# Patient Record
Sex: Female | Born: 1980 | Hispanic: No | Marital: Married | Smoking: Former smoker
Health system: Southern US, Community
[De-identification: ages and names within clinical notes are randomized; demographics above are authoritative.]

## PROBLEM LIST (undated history)

## (undated) DIAGNOSIS — O24419 Gestational diabetes mellitus in pregnancy, unspecified control: Secondary | ICD-10-CM

## (undated) HISTORY — PX: BREAST LUMPECTOMY: SHX2

## (undated) HISTORY — PX: MULTIPLE TOOTH EXTRACTIONS: SHX2053

## (undated) HISTORY — DX: Gestational diabetes mellitus in pregnancy, unspecified control: O24.419

---

## 2001-03-16 ENCOUNTER — Encounter: Admission: RE | Admit: 2001-03-16 | Discharge: 2001-03-16 | Payer: Self-pay | Admitting: *Deleted

## 2003-09-04 ENCOUNTER — Other Ambulatory Visit: Admission: RE | Admit: 2003-09-04 | Discharge: 2003-09-04 | Payer: Self-pay | Admitting: Obstetrics and Gynecology

## 2004-04-16 ENCOUNTER — Inpatient Hospital Stay (HOSPITAL_COMMUNITY): Admission: AD | Admit: 2004-04-16 | Discharge: 2004-04-16 | Payer: Self-pay | Admitting: Obstetrics and Gynecology

## 2004-04-19 ENCOUNTER — Inpatient Hospital Stay (HOSPITAL_COMMUNITY): Admission: AD | Admit: 2004-04-19 | Discharge: 2004-04-20 | Payer: Self-pay | Admitting: Obstetrics and Gynecology

## 2004-04-20 ENCOUNTER — Inpatient Hospital Stay (HOSPITAL_COMMUNITY): Admission: AD | Admit: 2004-04-20 | Discharge: 2004-04-22 | Payer: Self-pay | Admitting: Obstetrics & Gynecology

## 2004-06-16 ENCOUNTER — Other Ambulatory Visit: Admission: RE | Admit: 2004-06-16 | Discharge: 2004-06-16 | Payer: Self-pay | Admitting: Obstetrics and Gynecology

## 2007-10-22 ENCOUNTER — Other Ambulatory Visit: Admission: RE | Admit: 2007-10-22 | Discharge: 2007-10-22 | Payer: Self-pay | Admitting: Nurse Practitioner

## 2009-07-01 ENCOUNTER — Other Ambulatory Visit: Admission: RE | Admit: 2009-07-01 | Discharge: 2009-07-01 | Payer: Self-pay | Admitting: Obstetrics and Gynecology

## 2010-12-14 ENCOUNTER — Other Ambulatory Visit: Payer: Self-pay | Admitting: Nurse Practitioner

## 2010-12-14 ENCOUNTER — Other Ambulatory Visit (HOSPITAL_COMMUNITY)
Admission: RE | Admit: 2010-12-14 | Discharge: 2010-12-14 | Disposition: A | Discharge status: Home / Self Care | Payer: PRIVATE HEALTH INSURANCE | Source: Ambulatory Visit | Attending: Obstetrics and Gynecology | Admitting: Obstetrics and Gynecology

## 2010-12-14 DIAGNOSIS — Z01419 Encounter for gynecological examination (general) (routine) without abnormal findings: Secondary | ICD-10-CM | POA: Insufficient documentation

## 2010-12-14 DIAGNOSIS — Z113 Encounter for screening for infections with a predominantly sexual mode of transmission: Secondary | ICD-10-CM | POA: Insufficient documentation

## 2012-11-19 ENCOUNTER — Encounter: Payer: Self-pay | Admitting: Obstetrics & Gynecology

## 2012-11-19 ENCOUNTER — Ambulatory Visit (INDEPENDENT_AMBULATORY_CARE_PROVIDER_SITE_OTHER): Payer: BC Managed Care – PPO | Admitting: Obstetrics & Gynecology

## 2012-11-19 VITALS — BP 99/65 | Temp 98.7°F | Wt 147.0 lb

## 2012-11-19 DIAGNOSIS — Z3481 Encounter for supervision of other normal pregnancy, first trimester: Secondary | ICD-10-CM

## 2012-11-19 DIAGNOSIS — Z3480 Encounter for supervision of other normal pregnancy, unspecified trimester: Secondary | ICD-10-CM

## 2012-11-19 DIAGNOSIS — Z3201 Encounter for pregnancy test, result positive: Secondary | ICD-10-CM

## 2012-11-19 LAB — POCT URINE PREGNANCY: Preg Test, Ur: NEGATIVE

## 2012-11-19 LAB — POCT URINALYSIS DIPSTICK
Bilirubin, UA: NEGATIVE
Blood, UA: NEGATIVE
Glucose, UA: NEGATIVE
Ketones, UA: NEGATIVE
Leukocytes, UA: NEGATIVE
Nitrite, UA: NEGATIVE
Protein, UA: NEGATIVE
Spec Grav, UA: 1.015
Urobilinogen, UA: NEGATIVE
pH, UA: 5

## 2012-11-19 LAB — OB RESULTS CONSOLE GC/CHLAMYDIA
Chlamydia: NEGATIVE
Gonorrhea: NEGATIVE

## 2012-11-19 NOTE — Patient Instructions (Signed)
Influenza Virus Vaccine (Flucelvax) What is this medicine? INFLUENZA VIRUS VACCINE (in floo EN zuh VAHY ruhs vak SEEN) helps to reduce the risk of getting influenza also known as the flu. The vaccine only helps protect you against some strains of the flu. This medicine may be used for other purposes; ask your health care provider or pharmacist if you have questions. COMMON BRAND NAME(S): FLUCELVAX What should I tell my health care provider before I take this medicine? They need to know if you have any of these conditions: -bleeding disorder like hemophilia -fever or infection -Guillain-Barre syndrome or other neurological problems -immune system problems -infection with the human immunodeficiency virus (HIV) or AIDS -low blood platelet counts -multiple sclerosis -an unusual or allergic reaction to influenza virus vaccine, other medicines, foods, dyes or preservatives -pregnant or trying to get pregnant -breast-feeding How should I use this medicine? This vaccine is for injection into a muscle. It is given by a health care professional. A copy of Vaccine Information Statements will be given before each vaccination. Read this sheet carefully each time. The sheet may change frequently. Talk to your pediatrician regarding the use of this medicine in children. Special care may be needed. Overdosage: If you think you've taken too much of this medicine contact a poison control center or emergency room at once. Overdosage: If you think you have taken too much of this medicine contact a poison control center or emergency room at once. NOTE: This medicine is only for you. Do not share this medicine with others. What if I miss a dose? This does not apply. What may interact with this medicine? -chemotherapy or radiation therapy -medicines that lower your immune system like etanercept, anakinra, infliximab, and adalimumab -medicines that treat or prevent blood clots like  warfarin -phenytoin -steroid medicines like prednisone or cortisone -theophylline -vaccines This list may not describe all possible interactions. Give your health care provider a list of all the medicines, herbs, non-prescription drugs, or dietary supplements you use. Also tell them if you smoke, drink alcohol, or use illegal drugs. Some items may interact with your medicine. What should I watch for while using this medicine? Report any side effects that do not go away within 3 days to your doctor or health care professional. Call your health care provider if any unusual symptoms occur within 6 weeks of receiving this vaccine. You may still catch the flu, but the illness is not usually as bad. You cannot get the flu from the vaccine. The vaccine will not protect against colds or other illnesses that may cause fever. The vaccine is needed every year. What side effects may I notice from receiving this medicine? Side effects that you should report to your doctor or health care professional as soon as possible: -allergic reactions like skin rash, itching or hives, swelling of the face, lips, or tongue Side effects that usually do not require medical attention (Report these to your doctor or health care professional if they continue or are bothersome.): -fever -headache -muscle aches and pains -pain, tenderness, redness, or swelling at the injection site -tiredness This list may not describe all possible side effects. Call your doctor for medical advice about side effects. You may report side effects to FDA at 1-800-FDA-1088. Where should I keep my medicine? The vaccine will be given by a health care professional in a clinic, pharmacy, doctor's office, or other health care setting. You will not be given vaccine doses to store at home. NOTE: This sheet is a summary.   It may not cover all possible information. If you have questions about this medicine, talk to your doctor, pharmacist, or health care  provider.  2014, Elsevier/Gold Standard. (2010-12-08 14:06:47) Cystic Fibrosis Cystic fibrosis (CF) is an inherited disease caused by a defective gene. This defective gene leads to a change in a protein that affects cells that make mucus and sweat. Mucus is usually thin and watery. It is needed to keep the body working properly. But in CF, mucus becomes thick and very sticky. It clogs up the glands, airways and other passageways in the body. This makes it difficult to breathe freely. It also makes lung infections more likely. CF can also make it hard for the body to get the fat and protein it needs from food. CF is often thought of as a lung disease. Breathing (respiratory) failure is its most serious consequence. But, in CF, other parts of the body can also be affected including the skin, pancreas, liver, intestines, sinuses, and sex organs. The signs and symptoms of CF vary from person to person. They also differ by age. CF is a lifelong disease. However, people can live long, productive lives with care and treatment. CAUSES  CF is a genetic disease. CF is inherited as a recessive trait. That means a child can have CF even if neither of the child's parents have the disease. Both parents simply carry a gene that can cause CF. You are at greater risk for CF if:  One of your brothers or sisters has the disease.  Your parents carry the gene that causes it.  One of your parents has the disease. SYMPTOMS   Difficulty gaining weight.  Not growing as big as other children the same age.  Chronic gas or bloating.  Bulky or greasy bowel movements.  Tissue from the rectum protrudes from the anal opening (rectal prolapse).  Inflammation of the pancreas (recurrent pancreatitis).  Prolonged yellowing of the skin (jaundice).  Unusually salty sweat or skin.  Heat exhaustion with low salt in the blood.  Chronic coughing and/or wheezing.  Shortness of breath.  Frequent lung infections  (bronchiolitis, bronchitis, pneumonia).  Frequent sinusitis and/or nasal polyps (small growths in the nose).  Club-shaped fingers or toes. DIAGNOSIS  A caregiver will probably ask many questions and order some tests. These will help determine if CF is present. They also will provide valuable information about the nature and extent of the disease. Questions and tests may include:  Family history. You will be asked whether any relatives have CF.  Symptoms. You will be asked whether any of the symptoms listed above are present.  Sweat test. This is the standard test used to make a diagnosis of CF. It is also called a sweat chloride test. A chemical will be applied to make a patch of skin sweat. Then the sweat will be analyzed to see if it contains the chemicals present in CF.  DNA testing. This can be used to find what genetic mutations in CFTR are present.  Newborn screening. A blood test measures a chemical in the blood that is often elevated in newborns with CF. Extra testing is done if a blood test is abnormal or if a new baby shows signs of CF.  Nasal potential difference measurement. This test shows how CF is affecting the lining of the body's upper breathing passages. Other tests that will be performed after a diagnosis of CF is made:  Blood tests to evaluate salt balance, liver function, vitamin levels and blood counts.  X-rays. Chest and/or sinus x-rays can show how the lungs and breathing system are affected by the disease.  Lung (pulmonary) function tests. They tell how well the lungs are working.  Sputum culture. This test analyzes mucus from the lungs to find out if there is an infection.  Stool testing. This is done to see if the pancreas is working normally.  Fertility testing. In males, an ultrasound scan of the testes might be done. RISKS AND COMPLICATIONS CF can lead to other problems. They might include:  Frequent lung infections. These include pneumonia and  bronchitis.  Frequent sinus infections (sinusitis).  Coughing up blood (hemoptysis).  Collapsed lung (pneumothorax). A small hole develops in the lung, and air leaks out.  Malnutrition. This can occur because the disease prevents the body from digesting food properly and absorbing needed nutrients.  Delayed growth. This is caused by malnutrition.  Nasal polyps.  Thick mucus can block the ducts into and out of the pancreas (pancreatitis). This causes the pancreas to become inflamed.  Diabetes. This develops if thick mucus keeps the pancreas from working properly. The pancreas controls the level of sugar in the blood.  Gallstones.  Liver damage. Thick mucus can block the duct that carries bile (a digestive fluid) out of the liver to be expelled from the body.  Rectal prolapse (the inner lining of the rectum pushes outside the body). Excessive coughing can cause this.  Fertility problems. In men, the tube that connects sexual organs can become clogged by mucus. Also, the vas deferens (a tube that moves sperm through the testes) may be missing in men with CF. Women with CF are sometimes less fertile than those without the disease.  Development of weak, brittle bones (osteoporosis). This occurs when the body does not absorb the vitamins needed to keep bones strong. TREATMENT  Patients with CF should be managed through an accredited CF Center (to find a CF Center near you go to the Cystic Fibrosis Foundation website at www.GamingBus.hu). Treatment may include:  A high calorie diet. This can help maintain a healthy weight. That, in turn, helps reduce coughing and breathing problems.  A high salt diet or salt supplements. The body needs a balance of salt and other chemicals.  Drinking lots of fluids.  Exercise.  Enzyme capsules to aid digestion.  Nutritional supplements.  Medications to help with breathing.  Medications to thin the mucus.  Vaccines to prevent infections.  Insulin  medication, if diabetes is present.  Antibiotics to treat infections.  Chest percussion ("thumping") treatments to help move mucus.  Oxygen.  Surgery:  To relieve a digestive system blockage.  To remove nasal polyps.  Lung or liver transplants. At times, people with CF need to be given some treatments in a hospital. HOME CARE INSTRUCTIONS  To get started with home care:  Learn how to give all medicines that have been prescribed.  Learn which symptoms require medical attention.  Learn how to give treatments to clear mucus.  Learn techniques to improve breathing.  Learn how to prepare the right kind of foods.  Join a support group for people with CF. Groups also exist for family members.  Do not smoke and keep yourself and your child away from smoke.  Be as physically active as possible.  Keep shots (immunizations/vaccinations) up-to-date.  Wash hands often. This will reduce the chance of infection. SEEK MEDICAL CARE IF:   Mucus contains blood.  There is a new cough or change in your cough.  The amount  or consistency of mucus changes.  It becomes more difficult to breathe.  You or your child does not feel like eating or is losing weight.  You or your child has no energy.  You or your child has severe constipation.  You or your child has severe diarrhea.  You or your child is throwing up dark green fluid.  You or your child has an oral temperature above 102 F (38.9 C).  Your baby is older than 3 months with a rectal temperature of 100.5 F (38.1 C) or higher for more than 1 day. SEEK IMMEDIATE MEDICAL CARE IF:   You or your child has an oral temperature above 102 F (38.9 C), not controlled by medicine.  Your baby is older than 3 months with a rectal temperature of 102 F (38.9 C) or higher.  Your baby is 31 months old or younger with a rectal temperature of 100.4 F (38 C) or higher. Document Released: 10/30/2007 Document Revised: 03/21/2011  Document Reviewed: 01/17/2008 [facility] Patient Information 2014 [place], [place].

## 2012-11-19 NOTE — Progress Notes (Signed)
Subjective:    Stacy [name] is being seen today for her first obstetrical visit.  This is a planned pregnancy.  . Relationship with FOB: significant other, living together. Patient does intend to breast feed. Pregnancy history fully reviewed.  Menstrual History: OB History   Grav Para Term Preterm Abortions TAB SAB Ect Mult Living   1 1 1       1        Patient's last menstrual period was 09/18/2012.    The following portions of the patient's history were reviewed and updated as appropriate: allergies, current medications, past family history, past medical history, past social history, past surgical history and problem list.  Review of Systems Pertinent items are noted in HPI.    Objective:      General Appearance:    Alert, cooperative, no distress, appears stated age  Head:    Normocephalic, without obvious abnormality, atraumatic  Eyes:    PERRL, conjunctiva/corneas clear, EOM's intact, fundi    benign, both eyes  Ears:    Normal TM's and external ear canals, both ears  Nose:   Nares normal, septum midline, mucosa normal, no drainage    or sinus tenderness  Throat:   Lips, mucosa, and tongue normal; teeth and gums normal  Neck:   Supple, symmetrical, trachea midline, no adenopathy;    thyroid:  no enlargement/tenderness/nodules; no carotid   bruit or JVD  Back:     Symmetric, no curvature, ROM normal, no CVA tenderness  Lungs:     Clear to auscultation bilaterally, respirations unlabored  Chest Wall:    No tenderness or deformity   Heart:    Regular rate and rhythm, S1 and S2 normal, no murmur, rub   or gallop  Breast Exam:    No tenderness, masses, or nipple abnormality  Abdomen:     Soft, non-tender, bowel sounds active all four quadrants,    no masses, no organomegaly  Genitalia:    Normal female without lesion, discharge or tenderness  Extremities:   Extremities normal, atraumatic, no cyanosis or edema  Pulses:   2+ and symmetric all extremities  Skin:   Skin  color, texture, turgor normal, no rashes or lesions  Lymph nodes:   Cervical, supraclavicular, and axillary nodes normal  Neurologic:   CNII-XII intact, normal strength, sensation and reflexes    throughout  Informal U/S: CRL [redacted]w[redacted]d; cardiac activity present      Assessment:    Pregnancy at 8+ weeks  Plan:    Initial labs drawn. Prenatal vitamins.  Counseling provided regarding continued use of seat belts, cessation of alcohol consumption, smoking or use of illicit drugs; infection precautions i.e., influenza/TDAP immunizations, toxoplasmosis,CMV, parvovirus, listeria and varicella; workplace safety, exercise during pregnancy; routine dental care, safe medications, sexual activity, hot tubs, saunas, pools, travel, caffeine use, fish and methlymercury, potential toxins, hair treatments, varicose veins Weight gain recommendations reviewed: underweight/BMI< 18.5--> gain 28 - 40 lbs; normal weight/BMI 18.5 - 24.9--> gain 25 - 35 lbs; overweight/BMI 25 - 29.9--> gain 15 - 25 lbs; obese/BMI >30->gain  11 - 20 lbs Problem list reviewed and updated. FIRST/CF mutation testing discussed. Role of ultrasound in pregnancy discussed. Amniocentesis discussed: not indicated. Follow up in 6 weeks. 50% of 20 min visit spent on counseling and coordination of care.

## 2012-11-20 LAB — OBSTETRIC PANEL
Antibody Screen: NEGATIVE
Basophils Absolute: 0 10*3/uL (ref 0.0–0.1)
Basophils Relative: 0 % (ref 0–1)
Eosinophils Absolute: 0.1 10*3/uL (ref 0.0–0.7)
Eosinophils Relative: 1 % (ref 0–5)
HCT: 35.8 % — ABNORMAL LOW (ref 36.0–46.0)
Hemoglobin: 12.3 g/dL (ref 12.0–15.0)
Hepatitis B Surface Ag: NEGATIVE
Lymphocytes Relative: 23 % (ref 12–46)
Lymphs Abs: 2.2 10*3/uL (ref 0.7–4.0)
MCH: 28.9 pg (ref 26.0–34.0)
MCHC: 34.4 g/dL (ref 30.0–36.0)
MCV: 84 fL (ref 78.0–100.0)
Monocytes Absolute: 0.8 10*3/uL (ref 0.1–1.0)
Monocytes Relative: 9 % (ref 3–12)
Neutro Abs: 6.3 10*3/uL (ref 1.7–7.7)
Neutrophils Relative %: 67 % (ref 43–77)
Platelets: 322 10*3/uL (ref 150–400)
RBC: 4.26 MIL/uL (ref 3.87–5.11)
RDW: 14.3 % (ref 11.5–15.5)
Rh Type: POSITIVE
Rubella: 1.43 Index — ABNORMAL HIGH (ref <0.90)
WBC: 9.5 10*3/uL (ref 4.0–10.5)

## 2012-11-20 LAB — VITAMIN D 25 HYDROXY (VIT D DEFICIENCY, FRACTURES): Vit D, 25-Hydroxy: 36 ng/mL (ref 30–89)

## 2012-11-20 LAB — HIV ANTIBODY (ROUTINE TESTING W REFLEX): HIV: NONREACTIVE

## 2012-11-20 LAB — GC/CHLAMYDIA PROBE AMP
CT Probe RNA: NEGATIVE
GC Probe RNA: NEGATIVE

## 2012-11-20 LAB — CULTURE, OB URINE: Organism ID, Bacteria: NO GROWTH

## 2012-11-20 LAB — HEMOGLOBIN A1C
Hgb A1c MFr Bld: 5.6 % (ref <5.7)
Mean Plasma Glucose: 114 mg/dL (ref <117)

## 2012-11-21 LAB — HEMOGLOBINOPATHY EVALUATION
Hemoglobin Other: 0 %
Hgb A2 Quant: 2.8 % (ref 2.2–3.2)
Hgb A: 96.4 % — ABNORMAL LOW (ref 96.8–97.8)
Hgb F Quant: 0.8 % (ref 0.0–2.0)
Hgb S Quant: 0 %

## 2012-12-27 ENCOUNTER — Encounter: Payer: Self-pay | Admitting: Obstetrics & Gynecology

## 2012-12-27 ENCOUNTER — Ambulatory Visit (INDEPENDENT_AMBULATORY_CARE_PROVIDER_SITE_OTHER): Payer: BC Managed Care – PPO | Admitting: Obstetrics & Gynecology

## 2012-12-27 VITALS — BP 100/64 | Temp 97.8°F | Ht 62.0 in | Wt 151.0 lb

## 2012-12-27 DIAGNOSIS — Z3481 Encounter for supervision of other normal pregnancy, first trimester: Secondary | ICD-10-CM

## 2012-12-27 DIAGNOSIS — Z3480 Encounter for supervision of other normal pregnancy, unspecified trimester: Secondary | ICD-10-CM

## 2012-12-27 LAB — POCT URINALYSIS DIPSTICK
Bilirubin, UA: NEGATIVE
Blood, UA: NEGATIVE
Glucose, UA: NEGATIVE
Ketones, UA: NEGATIVE
Leukocytes, UA: NEGATIVE
Nitrite, UA: NEGATIVE
Protein, UA: NEGATIVE
Spec Grav, UA: 1.02
Urobilinogen, UA: NEGATIVE
pH, UA: 5

## 2012-12-27 NOTE — Progress Notes (Signed)
HR - 80 Pt in office today for routine OB visit, denies concerns at this time

## 2012-12-31 ENCOUNTER — Encounter: Payer: BC Managed Care – PPO | Admitting: Obstetrics & Gynecology

## 2013-01-02 NOTE — Patient Instructions (Signed)
Second Trimester of Pregnancy The second trimester is from week 13 through week 28, months 4 through 6. The second trimester is often a time when you feel your best. Your body has also adjusted to being pregnant, and you begin to feel better physically. Usually, morning sickness has lessened or quit completely, you may have more energy, and you may have an increase in appetite. The second trimester is also a time when the fetus is growing rapidly. At the end of the sixth month, the fetus is about 9 inches long and weighs about 1 pounds. You will likely begin to feel the baby move (quickening) between 18 and 20 weeks of the pregnancy. BODY CHANGES Your body goes through many changes during pregnancy. The changes vary from woman to woman.   Your weight will continue to increase. You will notice your lower abdomen bulging out.  You may begin to get stretch marks on your hips, abdomen, and breasts.  You may develop headaches that can be relieved by medicines approved by your caregiver.  You may urinate more often because the fetus is pressing on your bladder.  You may develop or continue to have heartburn as a result of your pregnancy.  You may develop constipation because certain hormones are causing the muscles that push waste through your intestines to slow down.  You may develop hemorrhoids or swollen, bulging veins (varicose veins).  You may have back pain because of the weight gain and pregnancy hormones relaxing your joints between the bones in your pelvis and as a result of a shift in weight and the muscles that support your balance.  Your breasts will continue to grow and be tender.  Your gums may bleed and may be sensitive to brushing and flossing.  Dark spots or blotches (chloasma, mask of pregnancy) may develop on your face. This will likely fade after the baby is born.  A dark line from your belly button to the pubic area (linea nigra) may appear. This will likely fade after the  baby is born. WHAT TO EXPECT AT YOUR PRENATAL VISITS During a routine prenatal visit:  You will be weighed to make sure you and the fetus are growing normally.  Your blood pressure will be taken.  Your abdomen will be measured to track your baby's growth.  The fetal heartbeat will be listened to.  Any test results from the previous visit will be discussed. Your caregiver may ask you:  How you are feeling.  If you are feeling the baby move.  If you have had any abnormal symptoms, such as leaking fluid, bleeding, severe headaches, or abdominal cramping.  If you have any questions. Other tests that may be performed during your second trimester include:  Blood tests that check for:  Low iron levels (anemia).  Gestational diabetes (between 24 and 28 weeks).  Rh antibodies.  Urine tests to check for infections, diabetes, or protein in the urine.  An ultrasound to confirm the proper growth and development of the baby.  An amniocentesis to check for possible genetic problems.  Fetal screens for spina bifida and Down syndrome. HOME CARE INSTRUCTIONS   Avoid all smoking, herbs, alcohol, and unprescribed drugs. These chemicals affect the formation and growth of the baby.  Follow your caregiver's instructions regarding medicine use. There are medicines that are either safe or unsafe to take during pregnancy.  Exercise only as directed by your caregiver. Experiencing uterine cramps is a good sign to stop exercising.  Continue to eat regular,   healthy meals.  Wear a good support bra for breast tenderness.  Do not use hot tubs, steam rooms, or saunas.  Wear your seat belt at all times when driving.  Avoid raw meat, uncooked cheese, cat litter boxes, and soil used by cats. These carry germs that can cause birth defects in the baby.  Take your prenatal vitamins.  Try taking a stool softener (if your caregiver approves) if you develop constipation. Eat more high-fiber foods,  such as fresh vegetables or fruit and whole grains. Drink plenty of fluids to keep your urine clear or pale yellow.  Take warm sitz baths to soothe any pain or discomfort caused by hemorrhoids. Use hemorrhoid cream if your caregiver approves.  If you develop varicose veins, wear support hose. Elevate your feet for 15 minutes, 3 4 times a day. Limit salt in your diet.  Avoid heavy lifting, wear low heel shoes, and practice good posture.  Rest with your legs elevated if you have leg cramps or low back pain.  Visit your dentist if you have not gone yet during your pregnancy. Use a soft toothbrush to brush your teeth and be gentle when you floss.  A sexual relationship may be continued unless your caregiver directs you otherwise.  Continue to go to all your prenatal visits as directed by your caregiver. SEEK MEDICAL CARE IF:   You have dizziness.  You have mild pelvic cramps, pelvic pressure, or nagging pain in the abdominal area.  You have persistent nausea, vomiting, or diarrhea.  You have a bad smelling vaginal discharge.  You have pain with urination. SEEK IMMEDIATE MEDICAL CARE IF:   You have a fever.  You are leaking fluid from your vagina.  You have spotting or bleeding from your vagina.  You have severe abdominal cramping or pain.  You have rapid weight gain or loss.  You have shortness of breath with chest pain.  You notice sudden or extreme swelling of your face, hands, ankles, feet, or legs.  You have not felt your baby move in over an hour.  You have severe headaches that do not go away with medicine.  You have vision changes. Document Released: 12/21/2000 Document Revised: 08/29/2012 Document Reviewed: 02/28/2012 [employer] Patient Information 2014 [employer].  

## 2013-01-10 NOTE — L&D Delivery Note (Signed)
Delivery Note At 11:12 AM a viable female was delivered via Vaginal, Spontaneous Delivery (Presentation: ; Occiput Anterior).  APGAR: , ; weight .   Placenta status: , .  Cord: 3 vessels with the following complications: None.  Cord pH: not done  Anesthesia: Epidural  Episiotomy:  Lacerations:  Suture Repair: 2.0 Est. Blood Loss (mL):   Mom to postpartum.  Baby to Couplet care / Skin to Skin.  MARSHALL,BERNARD A 06/30/2013, 11:23 AM

## 2013-01-16 ENCOUNTER — Telehealth: Payer: Self-pay | Admitting: *Deleted

## 2013-01-17 ENCOUNTER — Other Ambulatory Visit (INDEPENDENT_AMBULATORY_CARE_PROVIDER_SITE_OTHER): Payer: BC Managed Care – PPO

## 2013-01-17 VITALS — BP 101/66 | HR 97 | Temp 98.5°F

## 2013-01-17 DIAGNOSIS — J029 Acute pharyngitis, unspecified: Secondary | ICD-10-CM

## 2013-01-17 LAB — POCT RAPID STREP A (OFFICE): Rapid Strep A Screen: NEGATIVE

## 2013-01-17 NOTE — Progress Notes (Signed)
Pt in office for rapid strep test

## 2013-01-17 NOTE — Addendum Note (Signed)
Addended by: Ashley RoyaltyWILLIAMS, Karson Reede V on: 01/17/2013 09:28 AM   Modules accepted: Orders

## 2013-01-29 ENCOUNTER — Other Ambulatory Visit: Payer: Self-pay | Admitting: *Deleted

## 2013-01-29 ENCOUNTER — Other Ambulatory Visit: Payer: BC Managed Care – PPO

## 2013-01-29 ENCOUNTER — Ambulatory Visit (INDEPENDENT_AMBULATORY_CARE_PROVIDER_SITE_OTHER): Payer: BC Managed Care – PPO

## 2013-01-29 DIAGNOSIS — Z1389 Encounter for screening for other disorder: Secondary | ICD-10-CM

## 2013-01-30 ENCOUNTER — Encounter: Payer: Self-pay | Admitting: Obstetrics

## 2013-01-30 LAB — US OB DETAIL + 14 WK

## 2013-02-07 ENCOUNTER — Ambulatory Visit (INDEPENDENT_AMBULATORY_CARE_PROVIDER_SITE_OTHER): Payer: BC Managed Care – PPO | Admitting: Obstetrics & Gynecology

## 2013-02-07 VITALS — BP 100/64 | Temp 98.2°F | Wt 155.0 lb

## 2013-02-07 DIAGNOSIS — Z3480 Encounter for supervision of other normal pregnancy, unspecified trimester: Secondary | ICD-10-CM

## 2013-02-07 LAB — POCT URINALYSIS DIPSTICK
Bilirubin, UA: NEGATIVE
Blood, UA: NEGATIVE
Glucose, UA: NEGATIVE
Nitrite, UA: NEGATIVE
Protein, UA: NEGATIVE
Spec Grav, UA: 1.025
Urobilinogen, UA: NEGATIVE
pH, UA: 6

## 2013-02-07 NOTE — Progress Notes (Signed)
Pulse 82 Pt states she is doing well. Pt would like to know if u/s had normal results.  Pt states that she wasn't sure of gender.

## 2013-02-07 NOTE — Patient Instructions (Signed)
Glucose Tolerance Test This is a test to see how your body processes carbohydrates. This test is often done to check patients for diabetes or the possibility of developing it. PREPARATION FOR TEST You should have nothing to eat or drink 12 hours before the test. You will be given a form of sugar (glucose) and then blood samples will be drawn from your vein to determine the level of sugar in your blood. Alternatively, blood may be drawn from your finger for testing. You should not smoke or exercise during the test. NORMAL FINDINGS  Fasting: 70-115 mg/dL  30 minutes: less than 200 mg/dL  1 hour: less than 200 mg/dL  2 hours: less than 140 mg/dL  3 hours: 70-115 mg/dL  4 hours: 70-115 mg/dL Ranges for normal findings may vary among different laboratories and hospitals. You should always check with your doctor after having lab work or other tests done to discuss the meaning of your test results and whether your values are considered within normal limits. MEANING OF TEST Your caregiver will go over the test results with you and discuss the importance and meaning of your results, as well as treatment options and the need for additional tests. OBTAINING THE TEST RESULTS It is your responsibility to obtain your test results. Ask the lab or department performing the test when and how you will get your results. Document Released: 01/20/2004 Document Revised: 03/21/2011 Document Reviewed: 12/08/2007 [employer] Patient Information 2014 [employer].  

## 2013-03-07 ENCOUNTER — Other Ambulatory Visit: Payer: BC Managed Care – PPO

## 2013-03-07 ENCOUNTER — Encounter: Payer: BC Managed Care – PPO | Admitting: Obstetrics & Gynecology

## 2013-03-14 ENCOUNTER — Other Ambulatory Visit: Payer: Medicaid Other

## 2013-03-14 ENCOUNTER — Ambulatory Visit (INDEPENDENT_AMBULATORY_CARE_PROVIDER_SITE_OTHER): Payer: BC Managed Care – PPO | Admitting: Obstetrics & Gynecology

## 2013-03-14 VITALS — BP 93/63 | Temp 98.6°F | Wt 161.0 lb

## 2013-03-14 DIAGNOSIS — N760 Acute vaginitis: Secondary | ICD-10-CM

## 2013-03-14 DIAGNOSIS — Z3480 Encounter for supervision of other normal pregnancy, unspecified trimester: Secondary | ICD-10-CM

## 2013-03-14 LAB — CBC
HCT: 33.6 % — ABNORMAL LOW (ref 36.0–46.0)
Hemoglobin: 11.3 g/dL — ABNORMAL LOW (ref 12.0–15.0)
MCH: 28.4 pg (ref 26.0–34.0)
MCHC: 33.6 g/dL (ref 30.0–36.0)
MCV: 84.4 fL (ref 78.0–100.0)
Platelets: 315 10*3/uL (ref 150–400)
RBC: 3.98 MIL/uL (ref 3.87–5.11)
RDW: 13.9 % (ref 11.5–15.5)
WBC: 9.7 10*3/uL (ref 4.0–10.5)

## 2013-03-14 LAB — POCT URINALYSIS DIPSTICK
Bilirubin, UA: NEGATIVE
Blood, UA: NEGATIVE
Glucose, UA: NEGATIVE
Ketones, UA: NEGATIVE
Nitrite, UA: NEGATIVE
Protein, UA: NEGATIVE
Spec Grav, UA: <=1.005
Urobilinogen, UA: NEGATIVE
pH, UA: 8

## 2013-03-14 LAB — POCT WET PREP (WET MOUNT)
Clue Cells Wet Prep Whiff POC: NEGATIVE
KOH Wet Prep POC: NEGATIVE
Trichomonas Wet Prep HPF POC: NEGATIVE

## 2013-03-14 MED ORDER — TERCONAZOLE 0.4 % VA CREA: 1.0000 | TOPICAL_CREAM | Freq: Every day | VAGINAL | Status: DC

## 2013-03-14 NOTE — Patient Instructions (Signed)
Vaginitis Vaginitis is an inflammation of the vagina. It is most often caused by a change in the normal balance of the bacteria and yeast that live in the vagina. This change in balance causes an overgrowth of certain bacteria or yeast, which causes the inflammation. There are different types of vaginitis, but the most common types are:  Bacterial vaginosis.  Yeast infection (candidiasis).  Trichomoniasis vaginitis. This is a sexually transmitted infection (STI).  Viral vaginitis.  Atropic vaginitis.  Allergic vaginitis. CAUSES  The cause depends on the type of vaginitis. Vaginitis can be caused by:  Bacteria (bacterial vaginosis).  Yeast (yeast infection).  A parasite (trichomoniasis vaginitis)  A virus (viral vaginitis).  Low hormone levels (atrophic vaginitis). Low hormone levels can occur during pregnancy, breastfeeding, or after menopause.  Irritants, such as bubble baths, scented tampons, and feminine sprays (allergic vaginitis). Other factors can change the normal balance of the yeast and bacteria that live in the vagina. These include:  Antibiotic medicines.  Poor hygiene.  Diaphragms, vaginal sponges, spermicides, birth control pills, and intrauterine devices (IUD).  Sexual intercourse.  Infection.  Uncontrolled diabetes.  A weakened immune system. SYMPTOMS  Symptoms can vary depending on the cause of the vaginitis. Common symptoms include:  Abnormal vaginal discharge.  The discharge is white, gray, or yellow with bacterial vaginosis.  The discharge is thick, white, and cheesy with a yeast infection.  The discharge is frothy and yellow or greenish with trichomoniasis.  A bad vaginal odor.  The odor is fishy with bacterial vaginosis.  Vaginal itching, pain, or swelling.  Painful intercourse.  Pain or burning when urinating. Sometimes, there are no symptoms. TREATMENT  Treatment will vary depending on the type of infection.   Bacterial  vaginosis and trichomoniasis are often treated with antibiotic creams or pills.  Yeast infections are often treated with antifungal medicines, such as vaginal creams or suppositories.  Viral vaginitis has no cure, but symptoms can be treated with medicines that relieve discomfort. Your sexual partner should be treated as well.  Atrophic vaginitis may be treated with an estrogen cream, pill, suppository, or vaginal ring. If vaginal dryness occurs, lubricants and moisturizing creams may help. You may be told to avoid scented soaps, sprays, or douches.  Allergic vaginitis treatment involves quitting the use of the product that is causing the problem. Vaginal creams can be used to treat the symptoms. HOME CARE INSTRUCTIONS   Take all medicines as directed by your caregiver.  Keep your genital area clean and dry. Avoid soap and only rinse the area with water.  Avoid douching. It can remove the healthy bacteria in the vagina.  Do not use tampons or have sexual intercourse until your vaginitis has been treated. Use sanitary pads while you have vaginitis.  Wipe from front to back. This avoids the spread of bacteria from the rectum to the vagina.  Let air reach your genital area.  Wear cotton underwear to decrease moisture buildup.  Avoid wearing underwear while you sleep until your vaginitis is gone.  Avoid tight pants and underwear or nylons without a cotton panel.  Take off wet clothing (especially bathing suits) as soon as possible.  Use mild, non-scented products. Avoid using irritants, such as:  Scented feminine sprays.  Fabric softeners.  Scented detergents.  Scented tampons.  Scented soaps or bubble baths.  Practice safe sex and use condoms. Condoms may prevent the spread of trichomoniasis and viral vaginitis. SEEK MEDICAL CARE IF:   You have abdominal pain.  You   have a fever or persistent symptoms for more than 2 3 days.  You have a fever and your symptoms suddenly  get worse. Document Released: 10/24/2006 Document Revised: 09/21/2011 Document Reviewed: 06/09/2011 [employer] Patient Information 2014 [employer].  

## 2013-03-14 NOTE — Progress Notes (Signed)
Pulse:85  Patient states she would like to be checked for an infection. Patient states she is having a thick yellow discharge. Patient would like to discuss her lab results. Possible candida vulvovaginitis.

## 2013-03-15 LAB — WET PREP BY MOLECULAR PROBE
Candida species: POSITIVE — AB
Gardnerella vaginalis: NEGATIVE
Trichomonas vaginosis: NEGATIVE

## 2013-03-15 LAB — GLUCOSE TOLERANCE, 2 HOURS W/ 1HR
Glucose, 1 hour: 168 mg/dL (ref 70–170)
Glucose, 2 hour: 141 mg/dL — ABNORMAL HIGH (ref 70–139)
Glucose, Fasting: 84 mg/dL (ref 70–99)

## 2013-03-15 LAB — HIV ANTIBODY (ROUTINE TESTING W REFLEX): HIV: NONREACTIVE

## 2013-03-15 LAB — RPR

## 2013-03-24 DIAGNOSIS — Z3480 Encounter for supervision of other normal pregnancy, unspecified trimester: Secondary | ICD-10-CM

## 2013-04-03 ENCOUNTER — Encounter: Payer: Self-pay | Admitting: Obstetrics

## 2013-04-03 ENCOUNTER — Ambulatory Visit (INDEPENDENT_AMBULATORY_CARE_PROVIDER_SITE_OTHER): Payer: BC Managed Care – PPO | Admitting: Obstetrics

## 2013-04-03 VITALS — BP 102/69 | Temp 97.9°F | Wt 161.0 lb

## 2013-04-03 DIAGNOSIS — Z3480 Encounter for supervision of other normal pregnancy, unspecified trimester: Secondary | ICD-10-CM

## 2013-04-03 LAB — POCT URINALYSIS DIPSTICK
Bilirubin, UA: NEGATIVE
Blood, UA: NEGATIVE
Glucose, UA: NEGATIVE
Ketones, UA: NEGATIVE
Leukocytes, UA: NEGATIVE
Nitrite, UA: NEGATIVE
Protein, UA: NEGATIVE
Spec Grav, UA: <=1.005
Urobilinogen, UA: NEGATIVE
pH, UA: 7

## 2013-04-03 NOTE — Progress Notes (Signed)
Pulse: 87 Patient denies any concerns. Patient would like to know the results of her 2 HR GTT.

## 2013-04-04 ENCOUNTER — Encounter: Payer: BC Managed Care – PPO | Admitting: Obstetrics & Gynecology

## 2013-04-25 ENCOUNTER — Ambulatory Visit (INDEPENDENT_AMBULATORY_CARE_PROVIDER_SITE_OTHER): Payer: Medicaid Other | Admitting: Obstetrics & Gynecology

## 2013-04-25 VITALS — BP 109/69 | Temp 98.2°F | Wt 164.0 lb

## 2013-04-25 DIAGNOSIS — Z3480 Encounter for supervision of other normal pregnancy, unspecified trimester: Secondary | ICD-10-CM

## 2013-04-25 LAB — POCT URINALYSIS DIPSTICK
Bilirubin, UA: NEGATIVE
Blood, UA: NEGATIVE
Glucose, UA: NEGATIVE
Nitrite, UA: NEGATIVE
Spec Grav, UA: >=1.03
Urobilinogen, UA: NEGATIVE
pH, UA: 5

## 2013-04-25 NOTE — Progress Notes (Deleted)
Pulse 90 Pt states that she is having lower pelvic and back pain.

## 2013-04-28 NOTE — Progress Notes (Signed)
Subjective:    Stacy [name] is a 33 y.o. female being seen today for her obstetrical visit. She is at 3132w5d gestation. Patient reports backache. Fetal movement: normal.   No past medical history on file.  Past Surgical History  Procedure Laterality Date  . Multiple tooth extractions Bilateral     for orthodontic care    Current outpatient prescriptions:Prenatal Vit-Fe Fumarate-FA (MULTIVITAMIN-PRENATAL) 27-0.8 MG TABS tablet, Take 1 tablet by mouth daily at 12 noon., Disp: , Rfl:  No Known Allergies  History  Substance Use Topics  . Smoking status: Former Games developermoker  . Smokeless tobacco: Never Used  . Alcohol Use: No    Family History  Problem Relation Age of Onset  . Diabetes Mother   . Hyperlipidemia Mother   . Diabetes Father      Review of Systems Constitutional: negative for anorexia Gastrointestinal: negative for abdominal pain Genitourinary:negative for vaginal discharge Musculoskeletal:positive for back pain Behavioral/Psych: negative for depression and tobacco use   Objective:    BP 109/69  Temp(Src) 98.2 F (36.8 C)  Wt 74.39 kg (164 lb)  LMP 09/18/2012 FHT:  140 BPM  Uterine Size: size equals dates  Presentation: cephalic     Assessment:    Pregnancy @ 2632w5d weeks  Doing well Plan:   labs reviewed, problem list updated. Cigarette smoking: never smoked. Orders Placed This Encounter  Procedures  . POCT urinalysis dipstick  Follow up in 2 Weeks.

## 2013-04-28 NOTE — Patient Instructions (Signed)
Third Trimester of Pregnancy  The third trimester is from week 29 through week 42, months 7 through 9. The third trimester is a time when the fetus is growing rapidly. At the end of the ninth month, the fetus is about 20 inches in length and weighs 6 10 pounds.   BODY CHANGES  Your body goes through many changes during pregnancy. The changes vary from woman to woman.    Your weight will continue to increase. You can expect to gain 25 35 pounds (11 16 kg) by the end of the pregnancy.   You may begin to get stretch marks on your hips, abdomen, and breasts.   You may urinate more often because the fetus is moving lower into your pelvis and pressing on your bladder.   You may develop or continue to have heartburn as a result of your pregnancy.   You may develop constipation because certain hormones are causing the muscles that push waste through your intestines to slow down.   You may develop hemorrhoids or swollen, bulging veins (varicose veins).   You may have pelvic pain because of the weight gain and pregnancy hormones relaxing your joints between the bones in your pelvis. Back aches may result from over exertion of the muscles supporting your posture.   Your breasts will continue to grow and be tender. A yellow discharge may leak from your breasts called colostrum.   Your belly button may stick out.   You may feel short of breath because of your expanding uterus.   You may notice the fetus "dropping," or moving lower in your abdomen.   You may have a bloody mucus discharge. This usually occurs a few days to a week before labor begins.   Your cervix becomes thin and soft (effaced) near your due date.  WHAT TO EXPECT AT YOUR PRENATAL EXAMS   You will have prenatal exams every 2 weeks until week 36. Then, you will have weekly prenatal exams. During a routine prenatal visit:   You will be weighed to make sure you and the fetus are growing normally.   Your blood pressure is taken.   Your abdomen will be  measured to track your baby's growth.   The fetal heartbeat will be listened to.   Any test results from the previous visit will be discussed.   You may have a cervical check near your due date to see if you have effaced.  At around 36 weeks, your caregiver will check your cervix. At the same time, your caregiver will also perform a test on the secretions of the vaginal tissue. This test is to determine if a type of bacteria, Group B streptococcus, is present. Your caregiver will explain this further.  Your caregiver may ask you:   What your birth plan is.   How you are feeling.   If you are feeling the baby move.   If you have had any abnormal symptoms, such as leaking fluid, bleeding, severe headaches, or abdominal cramping.   If you have any questions.  Other tests or screenings that may be performed during your third trimester include:   Blood tests that check for low iron levels (anemia).   Fetal testing to check the health, activity level, and growth of the fetus. Testing is done if you have certain medical conditions or if there are problems during the pregnancy.  FALSE LABOR  You may feel small, irregular contractions that eventually go away. These are called Braxton Hicks contractions, or   false labor. Contractions may last for hours, days, or even weeks before true labor sets in. If contractions come at regular intervals, intensify, or become painful, it is best to be seen by your caregiver.   SIGNS OF LABOR    Menstrual-like cramps.   Contractions that are 5 minutes apart or less.   Contractions that start on the top of the uterus and spread down to the lower abdomen and back.   A sense of increased pelvic pressure or back pain.   A watery or bloody mucus discharge that comes from the vagina.  If you have any of these signs before the 37th week of pregnancy, call your caregiver right away. You need to go to the hospital to get checked immediately.  HOME CARE INSTRUCTIONS    Avoid all  smoking, herbs, alcohol, and unprescribed drugs. These chemicals affect the formation and growth of the baby.   Follow your caregiver's instructions regarding medicine use. There are medicines that are either safe or unsafe to take during pregnancy.   Exercise only as directed by your caregiver. Experiencing uterine cramps is a good sign to stop exercising.   Continue to eat regular, healthy meals.   Wear a good support bra for breast tenderness.   Do not use hot tubs, steam rooms, or saunas.   Wear your seat belt at all times when driving.   Avoid raw meat, uncooked cheese, cat litter boxes, and soil used by cats. These carry germs that can cause birth defects in the baby.   Take your prenatal vitamins.   Try taking a stool softener (if your caregiver approves) if you develop constipation. Eat more high-fiber foods, such as fresh vegetables or fruit and whole grains. Drink plenty of fluids to keep your urine clear or pale yellow.   Take warm sitz baths to soothe any pain or discomfort caused by hemorrhoids. Use hemorrhoid cream if your caregiver approves.   If you develop varicose veins, wear support hose. Elevate your feet for 15 minutes, 3 4 times a day. Limit salt in your diet.   Avoid heavy lifting, wear low heal shoes, and practice good posture.   Rest a lot with your legs elevated if you have leg cramps or low back pain.   Visit your dentist if you have not gone during your pregnancy. Use a soft toothbrush to brush your teeth and be gentle when you floss.   A sexual relationship may be continued unless your caregiver directs you otherwise.   Do not travel far distances unless it is absolutely necessary and only with the approval of your caregiver.   Take prenatal classes to understand, practice, and ask questions about the labor and delivery.   Make a trial run to the hospital.   Pack your hospital bag.   Prepare the baby's nursery.   Continue to go to all your prenatal visits as directed  by your caregiver.  SEEK MEDICAL CARE IF:   You are unsure if you are in labor or if your water has broken.   You have dizziness.   You have mild pelvic cramps, pelvic pressure, or nagging pain in your abdominal area.   You have persistent nausea, vomiting, or diarrhea.   You have a bad smelling vaginal discharge.   You have pain with urination.  SEEK IMMEDIATE MEDICAL CARE IF:    You have a fever.   You are leaking fluid from your vagina.   You have spotting or bleeding from your vagina.     You have severe abdominal cramping or pain.   You have rapid weight loss or gain.   You have shortness of breath with chest pain.   You notice sudden or extreme swelling of your face, hands, ankles, feet, or legs.   You have not felt your baby move in over an hour.   You have severe headaches that do not go away with medicine.   You have vision changes.  Document Released: 12/21/2000 Document Revised: 08/29/2012 Document Reviewed: 02/28/2012  [employer] Patient Information 2014 [employer].

## 2013-05-09 ENCOUNTER — Encounter: Payer: Medicaid Other | Admitting: Obstetrics & Gynecology

## 2013-05-15 ENCOUNTER — Encounter: Payer: Self-pay | Admitting: Obstetrics & Gynecology

## 2013-05-15 ENCOUNTER — Ambulatory Visit (INDEPENDENT_AMBULATORY_CARE_PROVIDER_SITE_OTHER): Payer: Medicaid Other | Admitting: Obstetrics & Gynecology

## 2013-05-15 VITALS — BP 112/73 | HR 102 | Temp 98.6°F | Wt 167.4 lb

## 2013-05-15 DIAGNOSIS — Z3480 Encounter for supervision of other normal pregnancy, unspecified trimester: Secondary | ICD-10-CM

## 2013-05-15 LAB — POCT URINALYSIS DIPSTICK
Bilirubin, UA: NEGATIVE
Blood, UA: NEGATIVE
Glucose, UA: NEGATIVE
Nitrite, UA: NEGATIVE
Spec Grav, UA: 1.02
Urobilinogen, UA: NEGATIVE
pH, UA: 6

## 2013-05-15 NOTE — Progress Notes (Signed)
Subjective:    Stacy FritzLiliana [name] is a 33 y.o. female being seen today for her obstetrical visit. She is at 6283w1d gestation. Patient reports no complaints. Fetal movement: normal.   No past medical history on file.  Past Surgical History  Procedure Laterality Date  . Multiple tooth extractions Bilateral     for orthodontic care    Current outpatient prescriptions:Prenatal Vit-Fe Fumarate-FA (MULTIVITAMIN-PRENATAL) 27-0.8 MG TABS tablet, Take 1 tablet by mouth daily at 12 noon., Disp: , Rfl:  No Known Allergies  History  Substance Use Topics  . Smoking status: Former Games developermoker  . Smokeless tobacco: Never Used  . Alcohol Use: No    Family History  Problem Relation Age of Onset  . Diabetes Mother   . Hyperlipidemia Mother   . Diabetes Father      Review of Systems Constitutional: negative for anorexia Gastrointestinal: negative for abdominal pain Genitourinary:negative for vaginal discharge Musculoskeletal:positive for back pain Behavioral/Psych: negative for depression and tobacco use   Objective:    BP 112/73  Pulse 102  Temp(Src) 98.6 F (37 C)  Wt 75.932 kg (167 lb 6.4 oz)  LMP 09/18/2012 FHT:  140 BPM  Uterine Size: size equals dates  Presentation: cephalic     Assessment:    Pregnancy @ 3283w1d weeks  Doing well Plan:   labs reviewed, problem list updated.  Orders Placed This Encounter  Procedures  . POCT urinalysis dipstick  Follow up in 2 Weeks.

## 2013-05-15 NOTE — Patient Instructions (Signed)
Patient information: Group B streptococcus and pregnancy (Beyond the Basics)  Authors Karen M Puopolo, MD, PhD Carol J Baker, MD Section Editors Charles J Lockwood, MD Daniel J Sexton, MD Deputy Editor Vanessa A Barss, MD Disclosures  All topics are updated as new evidence becomes available and our peer review process is complete.  Literature review current through: Feb 2014.  This topic last updated: Jul 11, 2011.  INTRODUCTION - Group B streptococcus (GBS) is a bacterium that can cause serious infections in pregnant women and newborn babies. GBS is one of many types of streptococcal bacteria, sometimes called "strep." This article discusses GBS, its effect on pregnant women and infants, and ways to prevent complications of GBS. More detailed information about GBS is available by subscription. (See "Group B streptococcal infection in pregnant women".) WHAT IS GROUP B STREP INFECTION? - GBS is commonly found in the digestive system and the vagina. In healthy adults, GBS is not harmful and does not cause problems. But in pregnant women and newborn infants, being infected with GBS can cause serious illness. Approximately one in three to four pregnant women in the [place] carries GBS in their gastrointestinal system and/or in their vagina. Carrying GBS is not the same as being infected. Carriers are not sick and do not need treatment during pregnancy. There is no treatment that can stop you from carrying GBS.  Pregnant women who are carriers of GBS infrequently become infected with GBS. GBS can cause urinary tract infections, infection of the amniotic fluid (bag of water), and infection of the uterus after delivery. GBS infections during pregnancy may lead to preterm labor.  Pregnant women who carry GBS can pass on the bacteria to their newborns, and some of those babies become infected with GBS. Newborns who are infected with GBS can develop pneumonia (lung infection), septicemia (blood infection), or  meningitis (infection of the lining of the brain and spinal cord). These complications can be prevented by giving intravenous antibiotics during labor to any woman who is at risk of GBS infection. You are at risk of GBS infection if: You have a urine culture during your current pregnancy showing GBS  You have a vaginal and rectal culture during your current pregnancy showing GBS  You had an infant infected with GBS in the past GROUP B STREP PREVENTION - Most doctors and nurses recommend a urine culture early in your pregnancy to be sure that you do not have a bladder infection without symptoms. If you urine culture shows GBS or other bacteria, you may be treated with an antibiotic. If you have symptoms of urinary infection, such as pain with urination, any time during your pregnancy, a urine culture is done. If GBS grows from the urine culture, it should be treated with an antibiotic, and you should also receive intravenous antibiotics during labor. Expert groups recommend that all pregnant women have a GBS culture at 35 to 37 weeks of pregnancy. The culture is done by swabbing the vagina and rectum. If your GBS culture is positive, you will be given an intravenous antibiotic during labor. If you have preterm labor, the culture is done then and an intravenous antibiotic is given until the baby is born or the labor is stopped by your health care provider. If you have a positive GBS culture and you have an allergy to penicillin, be sure your doctor and nurse are aware of this allergy and tell them what happened with the allergy. If you had only a rash or itching, this   is not a serious allergy, and you can receive a common drug related to the penicillin. If you had a serious allergy (for example, trouble breathing, swelling of your face) you may need an additional test to determine which antibiotic should be used during labor. Being treated with an antibiotic during labor greatly reduces the chance that you or  your newborn will develop infections related to GBS. It is important to note that young infants up to age 3 months can also develop septicemia, meningitis and other serious infections from GBS. Being treated with an antibiotic during labor does not reduce the chance that your baby will develop this later type of infection. There is currently no known way of preventing this later-onset GBS disease. WHERE TO GET MORE INFORMATION - Your healthcare provider is the best source of information for questions and concerns related to your medical problem.  

## 2013-05-16 ENCOUNTER — Encounter: Payer: Medicaid Other | Admitting: Obstetrics & Gynecology

## 2013-05-29 ENCOUNTER — Encounter: Payer: Medicaid Other | Admitting: Obstetrics & Gynecology

## 2013-05-30 ENCOUNTER — Encounter: Payer: Self-pay | Admitting: Obstetrics & Gynecology

## 2013-05-30 ENCOUNTER — Ambulatory Visit (INDEPENDENT_AMBULATORY_CARE_PROVIDER_SITE_OTHER): Payer: Medicaid Other | Admitting: Obstetrics & Gynecology

## 2013-05-30 VITALS — BP 114/74 | HR 92 | Temp 98.2°F | Wt 168.0 lb

## 2013-05-30 DIAGNOSIS — Z3480 Encounter for supervision of other normal pregnancy, unspecified trimester: Secondary | ICD-10-CM

## 2013-05-30 LAB — POCT URINALYSIS DIPSTICK
Bilirubin, UA: NEGATIVE
Blood, UA: NEGATIVE
Glucose, UA: NEGATIVE
Leukocytes, UA: NEGATIVE
Nitrite, UA: NEGATIVE
Spec Grav, UA: 1.02
Urobilinogen, UA: NEGATIVE
pH, UA: 5.5

## 2013-06-01 LAB — STREP B DNA PROBE: GBSP: NOT DETECTED

## 2013-06-01 NOTE — Patient Instructions (Signed)
Patient information: Group B streptococcus and pregnancy (Beyond the Basics)  Authors Karen M Puopolo, MD, PhD Carol J Baker, MD Section Editors Charles J Lockwood, MD Daniel J Sexton, MD Deputy Editor Vanessa A Barss, MD Disclosures  All topics are updated as new evidence becomes available and our peer review process is complete.  Literature review current through: Feb 2014.  This topic last updated: Jul 11, 2011.  INTRODUCTION - Group B streptococcus (GBS) is a bacterium that can cause serious infections in pregnant women and newborn babies. GBS is one of many types of streptococcal bacteria, sometimes called "strep." This article discusses GBS, its effect on pregnant women and infants, and ways to prevent complications of GBS. More detailed information about GBS is available by subscription. (See "Group B streptococcal infection in pregnant women".) WHAT IS GROUP B STREP INFECTION? - GBS is commonly found in the digestive system and the vagina. In healthy adults, GBS is not harmful and does not cause problems. But in pregnant women and newborn infants, being infected with GBS can cause serious illness. Approximately one in three to four pregnant women in the [place] carries GBS in their gastrointestinal system and/or in their vagina. Carrying GBS is not the same as being infected. Carriers are not sick and do not need treatment during pregnancy. There is no treatment that can stop you from carrying GBS.  Pregnant women who are carriers of GBS infrequently become infected with GBS. GBS can cause urinary tract infections, infection of the amniotic fluid (bag of water), and infection of the uterus after delivery. GBS infections during pregnancy may lead to preterm labor.  Pregnant women who carry GBS can pass on the bacteria to their newborns, and some of those babies become infected with GBS. Newborns who are infected with GBS can develop pneumonia (lung infection), septicemia (blood infection), or  meningitis (infection of the lining of the brain and spinal cord). These complications can be prevented by giving intravenous antibiotics during labor to any woman who is at risk of GBS infection. You are at risk of GBS infection if: You have a urine culture during your current pregnancy showing GBS  You have a vaginal and rectal culture during your current pregnancy showing GBS  You had an infant infected with GBS in the past GROUP B STREP PREVENTION - Most doctors and nurses recommend a urine culture early in your pregnancy to be sure that you do not have a bladder infection without symptoms. If you urine culture shows GBS or other bacteria, you may be treated with an antibiotic. If you have symptoms of urinary infection, such as pain with urination, any time during your pregnancy, a urine culture is done. If GBS grows from the urine culture, it should be treated with an antibiotic, and you should also receive intravenous antibiotics during labor. Expert groups recommend that all pregnant women have a GBS culture at 35 to 37 weeks of pregnancy. The culture is done by swabbing the vagina and rectum. If your GBS culture is positive, you will be given an intravenous antibiotic during labor. If you have preterm labor, the culture is done then and an intravenous antibiotic is given until the baby is born or the labor is stopped by your health care provider. If you have a positive GBS culture and you have an allergy to penicillin, be sure your doctor and nurse are aware of this allergy and tell them what happened with the allergy. If you had only a rash or itching, this   is not a serious allergy, and you can receive a common drug related to the penicillin. If you had a serious allergy (for example, trouble breathing, swelling of your face) you may need an additional test to determine which antibiotic should be used during labor. Being treated with an antibiotic during labor greatly reduces the chance that you or  your newborn will develop infections related to GBS. It is important to note that young infants up to age 3 months can also develop septicemia, meningitis and other serious infections from GBS. Being treated with an antibiotic during labor does not reduce the chance that your baby will develop this later type of infection. There is currently no known way of preventing this later-onset GBS disease. WHERE TO GET MORE INFORMATION - Your healthcare provider is the best source of information for questions and concerns related to your medical problem.  

## 2013-06-01 NOTE — Progress Notes (Signed)
Subjective:    Stacy [name] is a 33 y.o. female being seen today for her obstetrical visit. She is at [redacted]w[redacted]d  gestation. Patient reports no complaints. Fetal movement: normal.   No past medical history on file.  Past Surgical History  Procedure Laterality Date  . Multiple tooth extractions Bilateral     for orthodontic care    Current outpatient prescriptions:Prenatal Vit-Fe Fumarate-FA (MULTIVITAMIN-PRENATAL) 27-0.8 MG TABS tablet, Take 1 tablet by mouth daily at 12 noon., Disp: , Rfl:  No Known Allergies  History  Substance Use Topics  . Smoking status: Former Games developer  . Smokeless tobacco: Never Used  . Alcohol Use: No    Family History  Problem Relation Age of Onset  . Diabetes Mother   . Hyperlipidemia Mother   . Diabetes Father      Review of Systems Constitutional: negative for anorexia Gastrointestinal: negative for abdominal pain Genitourinary:negative for vaginal discharge Musculoskeletal:positive for back pain Behavioral/Psych: negative for depression and tobacco use   Objective:    BP 114/74  Pulse 92  Temp(Src) 98.2 F (36.8 C)  Wt 76.204 kg (168 lb)  LMP 09/18/2012 FHT:  140 BPM  Uterine Size: size equals dates  Presentation: cephalic     Assessment:    Pregnancy @ [redacted]w[redacted]d  weeks  Doing well Plan:   labs reviewed, problem list updated.  Orders Placed This Encounter  Procedures  . Strep B DNA probe  . POCT urinalysis dipstick  Follow up in 1 Week.

## 2013-06-10 ENCOUNTER — Encounter: Payer: Self-pay | Admitting: Obstetrics & Gynecology

## 2013-06-10 ENCOUNTER — Ambulatory Visit (INDEPENDENT_AMBULATORY_CARE_PROVIDER_SITE_OTHER): Payer: Medicaid Other | Admitting: Obstetrics & Gynecology

## 2013-06-10 VITALS — BP 115/71 | HR 93 | Temp 97.6°F | Wt 169.0 lb

## 2013-06-10 DIAGNOSIS — Z3480 Encounter for supervision of other normal pregnancy, unspecified trimester: Secondary | ICD-10-CM

## 2013-06-10 LAB — POCT URINALYSIS DIPSTICK
Blood, UA: NEGATIVE
Glucose, UA: NEGATIVE
Ketones, UA: NEGATIVE
Nitrite, UA: NEGATIVE
Protein, UA: NEGATIVE
Spec Grav, UA: 1.015
pH, UA: 7

## 2013-06-11 NOTE — Progress Notes (Signed)
Subjective:    Stacy [name] is a 33 y.o. female being seen today for her obstetrical visit. She is at [redacted]w[redacted]d  gestation. Patient reports no complaints. Fetal movement: normal.   No past medical history on file.  Past Surgical History  Procedure Laterality Date  . Multiple tooth extractions Bilateral     for orthodontic care    Current outpatient prescriptions:Prenatal Vit-Fe Fumarate-FA (MULTIVITAMIN-PRENATAL) 27-0.8 MG TABS tablet, Take 1 tablet by mouth daily at 12 noon., Disp: , Rfl:  No Known Allergies  History  Substance Use Topics  . Smoking status: Former Games developer  . Smokeless tobacco: Never Used  . Alcohol Use: No    Family History  Problem Relation Age of Onset  . Diabetes Mother   . Hyperlipidemia Mother   . Diabetes Father      Review of Systems Constitutional: negative for anorexia Gastrointestinal: negative for abdominal pain Genitourinary:negative for vaginal discharge Musculoskeletal:positive for back pain Behavioral/Psych: negative for depression and tobacco use   Objective:    BP 115/71  Pulse 93  Temp(Src) 97.6 F (36.4 C)  Wt 76.658 kg (169 lb)  LMP 09/18/2012 FHT:  140 BPM  Uterine Size: size equals dates  Presentation: cephalic     Assessment:    Pregnancy @ [redacted]w[redacted]d  weeks  Doing well Plan:   labs reviewed, problem list updated.  Orders Placed This Encounter  Procedures  . POCT urinalysis dipstick  Follow up in 1 Week.

## 2013-06-19 ENCOUNTER — Ambulatory Visit (INDEPENDENT_AMBULATORY_CARE_PROVIDER_SITE_OTHER): Payer: Medicaid Other | Admitting: Obstetrics & Gynecology

## 2013-06-19 ENCOUNTER — Encounter: Payer: Self-pay | Admitting: Obstetrics & Gynecology

## 2013-06-19 VITALS — BP 115/68 | HR 92 | Temp 97.1°F | Wt 172.0 lb

## 2013-06-19 DIAGNOSIS — Z3480 Encounter for supervision of other normal pregnancy, unspecified trimester: Secondary | ICD-10-CM

## 2013-06-19 LAB — POCT URINALYSIS DIPSTICK
Bilirubin, UA: NEGATIVE
Blood, UA: NEGATIVE
Glucose, UA: NEGATIVE
Leukocytes, UA: NEGATIVE
Nitrite, UA: NEGATIVE
Protein, UA: NEGATIVE
Spec Grav, UA: 1.02
Urobilinogen, UA: NEGATIVE
pH, UA: 5

## 2013-06-21 NOTE — Progress Notes (Signed)
Subjective:    Stacy [name] is a 33 y.o. female being seen today for her obstetrical visit. She is at 83108w1d  gestation. Patient reports no complaints. Fetal movement: normal.   No past medical history on file.  Past Surgical History  Procedure Laterality Date  . Multiple tooth extractions Bilateral     for orthodontic care    Current outpatient prescriptions:Prenatal Vit-Fe Fumarate-FA (MULTIVITAMIN-PRENATAL) 27-0.8 MG TABS tablet, Take 1 tablet by mouth daily at 12 noon., Disp: , Rfl:  No Known Allergies  History  Substance Use Topics  . Smoking status: Former Games developermoker  . Smokeless tobacco: Never Used  . Alcohol Use: No    Family History  Problem Relation Age of Onset  . Diabetes Mother   . Hyperlipidemia Mother   . Diabetes Father      Review of Systems Constitutional: negative for anorexia Gastrointestinal: negative for abdominal pain Genitourinary:negative for vaginal discharge Musculoskeletal:positive for back pain Behavioral/Psych: negative for depression and tobacco use   Objective:    BP 115/68  Pulse 92  Temp(Src) 97.1 F (36.2 C)  Wt 78.019 kg (172 lb)  LMP 09/18/2012 FHT:  140 BPM  Uterine Size: size equals dates  Presentation: cephalic     Assessment:    Pregnancy @ 2108w1d  weeks  Doing well Plan:   labs reviewed, problem list updated.  Orders Placed This Encounter  Procedures  . POCT urinalysis dipstick  Follow up in 1 Week.

## 2013-06-24 ENCOUNTER — Ambulatory Visit (INDEPENDENT_AMBULATORY_CARE_PROVIDER_SITE_OTHER): Payer: Medicaid Other | Admitting: Obstetrics & Gynecology

## 2013-06-24 ENCOUNTER — Encounter: Payer: Self-pay | Admitting: Obstetrics & Gynecology

## 2013-06-24 ENCOUNTER — Other Ambulatory Visit: Payer: Self-pay | Admitting: *Deleted

## 2013-06-24 VITALS — BP 126/83 | HR 89 | Temp 98.3°F | Wt 172.0 lb

## 2013-06-24 DIAGNOSIS — Z3480 Encounter for supervision of other normal pregnancy, unspecified trimester: Secondary | ICD-10-CM

## 2013-06-24 DIAGNOSIS — O480 Post-term pregnancy: Secondary | ICD-10-CM

## 2013-06-24 LAB — POCT URINALYSIS DIPSTICK
Bilirubin, UA: NEGATIVE
Blood, UA: NEGATIVE
Glucose, UA: NEGATIVE
Ketones, UA: NEGATIVE
Leukocytes, UA: NEGATIVE
Nitrite, UA: NEGATIVE
Spec Grav, UA: 1.015
Urobilinogen, UA: NEGATIVE
pH, UA: 6

## 2013-06-24 NOTE — Patient Instructions (Signed)
Labor Induction  Labor induction is when steps are taken to cause a pregnant woman to begin the labor process. Most women go into labor on their own between 37 weeks and 42 weeks of the pregnancy. When this does not happen or when there is a medical need, methods may be used to induce labor. Labor induction causes a pregnant woman's uterus to contract. It also causes the cervix to soften (ripen), open (dilate), and thin out (efface). Usually, labor is not induced before 39 weeks of the pregnancy unless there is a problem with the baby or mother.  Before inducing labor, your health care provider will consider a number of factors, including the following:  The medical condition of you and the baby.   How many weeks along you are.   The status of the baby's lung maturity.   The condition of the cervix.   The position of the baby.  WHAT ARE THE REASONS FOR LABOR INDUCTION? Labor may be induced for the following reasons:  The health of the baby or mother is at risk.   The pregnancy is overdue by 1 week or more.   The water breaks but labor does not start on its own.   The mother has a health condition or serious illness, such as high blood pressure, infection, placental abruption, or diabetes.  The amniotic fluid amounts are low around the baby.   The baby is distressed.  Convenience or wanting the baby to be born on a certain date is not a reason for inducing labor. WHAT METHODS ARE USED FOR LABOR INDUCTION? Several methods of labor induction may be used, such as:   Prostaglandin medicine. This medicine causes the cervix to dilate and ripen. The medicine will also start contractions. It can be taken by mouth or by inserting a suppository into the vagina.   Inserting a thin tube (catheter) with a balloon on the end into the vagina to dilate the cervix. Once inserted, the balloon is expanded with water, which causes the cervix to open.   Stripping the membranes. Your health  care provider separates amniotic sac tissue from the cervix, causing the cervix to be stretched and causing the release of a hormone called progesterone. This may cause the uterus to contract. It is often done during an office visit. You will be sent home to wait for the contractions to begin. You will then come in for an induction.   Breaking the water. Your health care provider makes a hole in the amniotic sac using a small instrument. Once the amniotic sac breaks, contractions should begin. This may still take hours to see an effect.   Medicine to trigger or strengthen contractions. This medicine is given through an IV access tube inserted into a vein in your arm.  All of the methods of induction, besides stripping the membranes, will be done in the hospital. Induction is done in the hospital so that you and the baby can be carefully monitored.  HOW LONG DOES IT TAKE FOR LABOR TO BE INDUCED? Some inductions can take up to 2 3 days. Depending on the cervix, it usually takes less time. It takes longer when you are induced early in the pregnancy or if this is your first pregnancy. If a mother is still pregnant and the induction has been going on for 2 3 days, either the mother will be sent home or a cesarean delivery will be needed. WHAT ARE THE RISKS ASSOCIATED WITH LABOR INDUCTION? Some of the risks   of induction include:   Changes in fetal heart rate, such as too high, too low, or erratic.   Fetal distress.   Chance of infection for the mother and baby.   Increased chance of having a cesarean delivery.   Breaking off (abruption) of the placenta from the uterus (rare).   Uterine rupture (very rare).  When induction is needed for medical reasons, the benefits of induction may outweigh the risks. WHAT ARE SOME REASONS FOR NOT INDUCING LABOR? Labor induction should not be done if:   It is shown that your baby does not tolerate labor.   You have had previous surgeries on your  uterus, such as a myomectomy or the removal of fibroids.   Your placenta lies very low in the uterus and blocks the opening of the cervix (placenta previa).   Your baby is not in a head-down position.   The umbilical cord drops down into the birth canal in front of the baby. This could cut off the baby's blood and oxygen supply.   You have had a previous cesarean delivery.   There are unusual circumstances, such as the baby being extremely premature.  Document Released: 05/18/2006 Document Revised: 08/29/2012 Document Reviewed: 07/26/2012 Exit[employer] Patient Information 2014 [employer].  

## 2013-06-24 NOTE — Progress Notes (Signed)
Subjective:    Stacy [name] is a 33 y.o. female being seen today for her obstetrical visit. She is at 820w6d  gestation. Patient reports no complaints. Fetal movement: normal.   No past medical history on file.  Past Surgical History  Procedure Laterality Date  . Multiple tooth extractions Bilateral     for orthodontic care    Current outpatient prescriptions:Prenatal Vit-Fe Fumarate-FA (MULTIVITAMIN-PRENATAL) 27-0.8 MG TABS tablet, Take 1 tablet by mouth daily at 12 noon., Disp: , Rfl:  No Known Allergies  History  Substance Use Topics  . Smoking status: Former Games developermoker  . Smokeless tobacco: Never Used  . Alcohol Use: No    Family History  Problem Relation Age of Onset  . Diabetes Mother   . Hyperlipidemia Mother   . Diabetes Father      Review of Systems Constitutional: negative for anorexia Gastrointestinal: negative for abdominal pain Genitourinary:negative for vaginal discharge Musculoskeletal:positive for back pain Behavioral/Psych: negative for depression and tobacco use   Objective:    BP 126/83  Pulse 89  Temp(Src) 98.3 F (36.8 C)  Wt 78.019 kg (172 lb)  LMP 09/18/2012 FHT:  140 BPM  Uterine Size: size equals dates  Presentation: cephalic   EFW by Leopold's: 3000 - 3300 g  Assessment:    Pregnancy @ 5920w6d  weeks  Doing well Plan:   labs reviewed, problem list updated. BPP on 6/19 IOL @ 41 wks Orders Placed This Encounter  Procedures  . POCT urinalysis dipstick

## 2013-06-28 ENCOUNTER — Ambulatory Visit (HOSPITAL_COMMUNITY)
Admission: RE | Admit: 2013-06-28 | Discharge: 2013-06-28 | Disposition: A | Discharge status: Home / Self Care | Payer: Medicaid Other | Source: Ambulatory Visit | Attending: Obstetrics & Gynecology | Admitting: Obstetrics & Gynecology

## 2013-06-28 ENCOUNTER — Encounter (HOSPITAL_COMMUNITY): Payer: Self-pay | Admitting: *Deleted

## 2013-06-28 ENCOUNTER — Telehealth (HOSPITAL_COMMUNITY): Payer: Self-pay | Admitting: *Deleted

## 2013-06-28 DIAGNOSIS — O480 Post-term pregnancy: Secondary | ICD-10-CM

## 2013-06-28 NOTE — Telephone Encounter (Signed)
Preadmission screen  

## 2013-06-29 ENCOUNTER — Encounter (HOSPITAL_COMMUNITY): Payer: Self-pay

## 2013-06-29 ENCOUNTER — Inpatient Hospital Stay (HOSPITAL_COMMUNITY)
Admission: AD | Admit: 2013-06-29 | Discharge: 2013-07-01 | DRG: 775 | Disposition: A | Discharge status: Home / Self Care | Payer: Medicaid Other | Source: Ambulatory Visit | Attending: Obstetrics | Admitting: Obstetrics

## 2013-06-29 DIAGNOSIS — E669 Obesity, unspecified: Principal | ICD-10-CM | POA: Diagnosis present

## 2013-06-29 DIAGNOSIS — Z6831 Body mass index (BMI) 31.0-31.9, adult: Secondary | ICD-10-CM

## 2013-06-29 DIAGNOSIS — Z87891 Personal history of nicotine dependence: Secondary | ICD-10-CM

## 2013-06-29 DIAGNOSIS — Z3483 Encounter for supervision of other normal pregnancy, third trimester: Secondary | ICD-10-CM

## 2013-06-29 DIAGNOSIS — O99214 Obesity complicating childbirth: Principal | ICD-10-CM

## 2013-06-29 LAB — CBC
HCT: 32.4 % — ABNORMAL LOW (ref 36.0–46.0)
Hemoglobin: 10.7 g/dL — ABNORMAL LOW (ref 12.0–15.0)
MCH: 27.1 pg (ref 26.0–34.0)
MCHC: 33 g/dL (ref 30.0–36.0)
MCV: 82 fL (ref 78.0–100.0)
Platelets: 255 10*3/uL (ref 150–400)
RBC: 3.95 MIL/uL (ref 3.87–5.11)
RDW: 15.1 % (ref 11.5–15.5)
WBC: 10.3 10*3/uL (ref 4.0–10.5)

## 2013-06-29 MED ORDER — DIPHENHYDRAMINE HCL 50 MG/ML IJ SOLN: 12.5000 mg | INTRAMUSCULAR | Status: DC | PRN

## 2013-06-29 MED ORDER — FLEET ENEMA 7-19 GM/118ML RE ENEM: 1.0000 | ENEMA | RECTAL | Status: DC | PRN

## 2013-06-29 MED ORDER — FENTANYL 2.5 MCG/ML BUPIVACAINE 1/10 % EPIDURAL INFUSION (WH - ANES)
14.0000 mL/h | INTRAMUSCULAR | Status: DC | PRN
  Administered 2013-06-30 (×2): 14 mL/h via EPIDURAL
  Filled 2013-06-29 (×2): qty 125

## 2013-06-29 MED ORDER — EPHEDRINE 5 MG/ML INJ
10.0000 mg | INTRAVENOUS | Status: DC | PRN
  Filled 2013-06-29: qty 2

## 2013-06-29 MED ORDER — PHENYLEPHRINE 40 MCG/ML (10ML) SYRINGE FOR IV PUSH (FOR BLOOD PRESSURE SUPPORT)
80.0000 ug | PREFILLED_SYRINGE | INTRAVENOUS | Status: DC | PRN
  Filled 2013-06-29: qty 2

## 2013-06-29 MED ORDER — CITRIC ACID-SODIUM CITRATE 334-500 MG/5ML PO SOLN
30.0000 mL | ORAL | Status: DC | PRN
Start: 2013-06-29 — End: 2013-06-30

## 2013-06-29 MED ORDER — LACTATED RINGERS IV SOLN
500.0000 mL | Freq: Once | INTRAVENOUS | Status: AC
  Administered 2013-06-29: 500 mL via INTRAVENOUS

## 2013-06-29 MED ORDER — EPHEDRINE 5 MG/ML INJ
10.0000 mg | INTRAVENOUS | Status: DC | PRN
  Filled 2013-06-29: qty 4
  Filled 2013-06-29: qty 2

## 2013-06-29 MED ORDER — OXYCODONE-ACETAMINOPHEN 5-325 MG PO TABS: 1.0000 | ORAL_TABLET | ORAL | Status: DC | PRN

## 2013-06-29 MED ORDER — LACTATED RINGERS IV SOLN: 500.0000 mL | INTRAVENOUS | Status: DC | PRN

## 2013-06-29 MED ORDER — BUTORPHANOL TARTRATE 1 MG/ML IJ SOLN: 1.0000 mg | INTRAMUSCULAR | Status: DC | PRN

## 2013-06-29 MED ORDER — ACETAMINOPHEN 325 MG PO TABS: 650.0000 mg | ORAL_TABLET | ORAL | Status: DC | PRN

## 2013-06-29 MED ORDER — LIDOCAINE HCL (PF) 1 % IJ SOLN
30.0000 mL | INTRAMUSCULAR | Status: AC | PRN
  Administered 2013-06-30 (×2): 5 mL via SUBCUTANEOUS

## 2013-06-29 MED ORDER — IBUPROFEN 600 MG PO TABS
600.0000 mg | ORAL_TABLET | Freq: Four times a day (QID) | ORAL | Status: DC | PRN
  Administered 2013-06-30: 600 mg via ORAL
  Filled 2013-06-29: qty 1

## 2013-06-29 MED ORDER — ONDANSETRON HCL 4 MG/2ML IJ SOLN: 4.0000 mg | Freq: Four times a day (QID) | INTRAMUSCULAR | Status: DC | PRN

## 2013-06-29 MED ORDER — OXYTOCIN BOLUS FROM INFUSION
500.0000 mL | INTRAVENOUS | Status: DC
  Administered 2013-06-30: 500 mL via INTRAVENOUS

## 2013-06-29 MED ORDER — PHENYLEPHRINE 40 MCG/ML (10ML) SYRINGE FOR IV PUSH (FOR BLOOD PRESSURE SUPPORT)
80.0000 ug | PREFILLED_SYRINGE | INTRAVENOUS | Status: DC | PRN
  Filled 2013-06-29: qty 10
  Filled 2013-06-29: qty 2

## 2013-06-29 MED ORDER — LACTATED RINGERS IV SOLN
INTRAVENOUS | Status: DC
  Administered 2013-06-30: 09:00:00 via INTRAVENOUS

## 2013-06-29 MED ORDER — OXYTOCIN 40 UNITS IN LACTATED RINGERS INFUSION - SIMPLE MED
62.5000 mL/h | INTRAVENOUS | Status: DC
Start: 2013-06-29 — End: 2013-06-30

## 2013-06-29 NOTE — MAU Provider Note (Signed)
Stacy FritzLiliana [name] is a 33 y.o. G2P1001 @ 3667w4d gestation who presents to the MAU with ? ROM. She has been leaking some fluid and started contracting.  SSE No pooling noted. Fern negative.   BP 117/71  Pulse 78  Resp 20  Ht 5\' 2"  (1.575 m)  Wt 173 lb 3.2 oz (78.563 kg)  BMI 31.67 kg/m2  LMP 09/18/2012  Dilation: 4 Effacement (%): 80 Cervical Position: Posterior Station: -2 Presentation: Vertex Exam by:: Remigio EisenmengerBenji Stanley RN   33 y.o. female with contractions and dilated to 4 cm.   RN to call Dr. Gaynell FaceMarshall with report and monitor tracing

## 2013-06-29 NOTE — MAU Note (Addendum)
Contractions since 2300 last night. Gotten closer and stronger all day. Leaking fld last couple days. U/S yest and told fld ok. Cont to have some leaking today.

## 2013-06-30 ENCOUNTER — Encounter (HOSPITAL_COMMUNITY): Payer: Medicaid Other | Admitting: Anesthesiology

## 2013-06-30 ENCOUNTER — Inpatient Hospital Stay (HOSPITAL_COMMUNITY): Payer: Medicaid Other | Admitting: Anesthesiology

## 2013-06-30 ENCOUNTER — Encounter (HOSPITAL_COMMUNITY): Payer: Self-pay | Admitting: *Deleted

## 2013-06-30 LAB — ABO/RH: ABO/RH(D): AB POS

## 2013-06-30 LAB — TYPE AND SCREEN
ABO/RH(D): AB POS
Antibody Screen: NEGATIVE

## 2013-06-30 LAB — RPR

## 2013-06-30 MED ORDER — ZOLPIDEM TARTRATE 5 MG PO TABS: 5.0000 mg | ORAL_TABLET | Freq: Every evening | ORAL | Status: DC | PRN

## 2013-06-30 MED ORDER — TETANUS-DIPHTH-ACELL PERTUSSIS 5-2.5-18.5 LF-MCG/0.5 IM SUSP
0.5000 mL | Freq: Once | INTRAMUSCULAR | Status: AC
  Administered 2013-07-01: 0.5 mL via INTRAMUSCULAR
  Filled 2013-06-30: qty 0.5

## 2013-06-30 MED ORDER — LIDOCAINE HCL (PF) 1 % IJ SOLN
INTRAMUSCULAR | Status: DC
Start: 2013-06-30 — End: 2013-06-30
  Filled 2013-06-30: qty 30

## 2013-06-30 MED ORDER — OXYTOCIN 40 UNITS IN LACTATED RINGERS INFUSION - SIMPLE MED
1.0000 m[IU]/min | INTRAVENOUS | Status: DC
  Administered 2013-06-30: 2 m[IU]/min via INTRAVENOUS
  Filled 2013-06-30: qty 1000

## 2013-06-30 MED ORDER — DIPHENHYDRAMINE HCL 25 MG PO CAPS: 25.0000 mg | ORAL_CAPSULE | Freq: Four times a day (QID) | ORAL | Status: DC | PRN

## 2013-06-30 MED ORDER — OXYCODONE-ACETAMINOPHEN 5-325 MG PO TABS
1.0000 | ORAL_TABLET | ORAL | Status: DC | PRN
  Administered 2013-06-30 – 2013-07-01 (×2): 1 via ORAL
  Filled 2013-06-30 (×2): qty 1

## 2013-06-30 MED ORDER — TERBUTALINE SULFATE 1 MG/ML IJ SOLN: 0.2500 mg | Freq: Once | INTRAMUSCULAR | Status: DC | PRN

## 2013-06-30 MED ORDER — SENNOSIDES-DOCUSATE SODIUM 8.6-50 MG PO TABS
2.0000 | ORAL_TABLET | ORAL | Status: DC
  Administered 2013-07-01 (×2): 2 via ORAL
  Filled 2013-06-30 (×2): qty 2

## 2013-06-30 MED ORDER — BENZOCAINE-MENTHOL 20-0.5 % EX AERO
1.0000 "application " | INHALATION_SPRAY | CUTANEOUS | Status: DC | PRN
  Administered 2013-06-30: 1 via TOPICAL
  Filled 2013-06-30: qty 56

## 2013-06-30 MED ORDER — ONDANSETRON HCL 4 MG PO TABS: 4.0000 mg | ORAL_TABLET | ORAL | Status: DC | PRN

## 2013-06-30 MED ORDER — LANOLIN HYDROUS EX OINT: TOPICAL_OINTMENT | CUTANEOUS | Status: DC | PRN

## 2013-06-30 MED ORDER — WITCH HAZEL-GLYCERIN EX PADS
1.0000 | MEDICATED_PAD | CUTANEOUS | Status: DC | PRN
Start: 2013-06-30 — End: 2013-07-01

## 2013-06-30 MED ORDER — ONDANSETRON HCL 4 MG/2ML IJ SOLN: 4.0000 mg | INTRAMUSCULAR | Status: DC | PRN

## 2013-06-30 MED ORDER — SIMETHICONE 80 MG PO CHEW: 80.0000 mg | CHEWABLE_TABLET | ORAL | Status: DC | PRN

## 2013-06-30 MED ORDER — DIBUCAINE 1 % RE OINT
1.0000 "application " | TOPICAL_OINTMENT | RECTAL | Status: DC | PRN
  Filled 2013-06-30: qty 28

## 2013-06-30 MED ORDER — PRENATAL MULTIVITAMIN CH
1.0000 | ORAL_TABLET | Freq: Every day | ORAL | Status: DC
  Administered 2013-07-01: 1 via ORAL
  Filled 2013-06-30: qty 1

## 2013-06-30 MED ORDER — FERROUS SULFATE 325 (65 FE) MG PO TABS
325.0000 mg | ORAL_TABLET | Freq: Two times a day (BID) | ORAL | Status: DC
  Administered 2013-06-30 – 2013-07-01 (×3): 325 mg via ORAL
  Filled 2013-06-30 (×3): qty 1

## 2013-06-30 MED ORDER — IBUPROFEN 600 MG PO TABS
600.0000 mg | ORAL_TABLET | Freq: Four times a day (QID) | ORAL | Status: DC
Start: 2013-06-30 — End: 2013-07-01
  Administered 2013-06-30 – 2013-07-01 (×5): 600 mg via ORAL
  Filled 2013-06-30 (×5): qty 1

## 2013-06-30 NOTE — Anesthesia Procedure Notes (Signed)
Epidural Patient location during procedure: OB Start time: 06/30/2013 12:11 AM End time: 06/30/2013 12:26 AM  Staffing Anesthesiologist: MOSER, CHRIS Performed by: anesthesiologist   Preanesthetic Checklist Completed: patient identified, surgical consent, pre-op evaluation, timeout performed, IV checked, risks and benefits discussed and monitors and equipment checked  Epidural Patient position: sitting Prep: site prepped and draped and DuraPrep Patient monitoring: heart rate, cardiac monitor, continuous pulse ox and blood pressure Approach: midline Location: L3-L4 Injection technique: LOR saline  Needle:  Needle type: Tuohy  Needle gauge: 17 G Needle length: 9 cm Needle insertion depth: 7 cm Catheter type: closed end flexible Catheter size: 19 Gauge Catheter at skin depth: 14 cm Test dose: Other  Assessment Events: blood not aspirated, injection not painful, no injection resistance, negative IV test and no paresthesia  Additional Notes H+P and labs checked, risks and benefits discussed with the patient, consent obtained, procedure tolerated well and without complications.  Reason for block:procedure for pain

## 2013-06-30 NOTE — Anesthesia Preprocedure Evaluation (Signed)
Anesthesia Evaluation  Patient identified by MRN, date of birth, ID band Patient awake    Reviewed: Allergy & Precautions, H&P , NPO status , Patient's Chart, lab work & pertinent test results  History of Anesthesia Complications Negative for: history of anesthetic complications  Airway Mallampati: II TM Distance: >3 FB Neck ROM: Full    Dental  (+) Teeth Intact   Pulmonary neg shortness of breath, neg sleep apnea, neg COPDneg recent URI, former smoker,  breath sounds clear to auscultation        Cardiovascular negative cardio ROS  Rhythm:Regular     Neuro/Psych negative neurological ROS  negative psych ROS   GI/Hepatic negative GI ROS, Neg liver ROS,   Endo/Other  Morbid obesity  Renal/GU negative Renal ROS     Musculoskeletal   Abdominal   Peds  Hematology   Anesthesia Other Findings   Reproductive/Obstetrics (+) Pregnancy                           Anesthesia Physical Anesthesia Plan  ASA: II  Anesthesia Plan: Epidural   Post-op Pain Management:    Induction:   Airway Management Planned: Natural Airway  Additional Equipment:   Intra-op Plan:   Post-operative Plan:   Informed Consent: I have reviewed the patients History and Physical, chart, labs and discussed the procedure including the risks, benefits and alternatives for the proposed anesthesia with the patient or authorized representative who has indicated his/her understanding and acceptance.   Dental advisory given  Plan Discussed with: Anesthesiologist  Anesthesia Plan Comments:         Anesthesia Quick Evaluation

## 2013-06-30 NOTE — Anesthesia Postprocedure Evaluation (Signed)
  Anesthesia Post-op Note  Patient: Stacy [name]  Procedure(s) Performed: * No procedures listed *  Patient Location: Mother/Baby  Anesthesia Type:Epidural  Level of Consciousness: awake  Airway and Oxygen Therapy: Patient Spontanous Breathing  Post-op Pain: mild  Post-op Assessment: Patient's Cardiovascular Status Stable and Respiratory Function Stable  Post-op Vital Signs: stable  Last Vitals:  Filed Vitals:   06/30/13 1218  BP: 115/70  Pulse: 98  Temp:   Resp:     Complications: No apparent anesthesia complications

## 2013-06-30 NOTE — H&P (Signed)
This is Dr. Francoise CeoBernard Parveen Freehling dictating the history and physical on  Stacy [name] she's a 33 year old gravida 2 para 1001 negative GBS 40 weeks and 5 days EDC 660 him in in labor and is no 7 cm 100% vertex -2 amniotomy performed the fluid was meconium-stained tracing is reactive Past medical history negative Past surgical history negative Social history negative System review negative Physical well-developed female in no distress HEENT negative Lungs clear to P&A Heart regular rhythm no murmurs no gallops Daily with Abdomen term Extremities negative and a

## 2013-07-01 LAB — CBC
HCT: 27.5 % — ABNORMAL LOW (ref 36.0–46.0)
Hemoglobin: 9 g/dL — ABNORMAL LOW (ref 12.0–15.0)
MCH: 26.9 pg (ref 26.0–34.0)
MCHC: 32.7 g/dL (ref 30.0–36.0)
MCV: 82.3 fL (ref 78.0–100.0)
Platelets: 221 10*3/uL (ref 150–400)
RBC: 3.34 MIL/uL — ABNORMAL LOW (ref 3.87–5.11)
RDW: 15.5 % (ref 11.5–15.5)
WBC: 11.3 10*3/uL — ABNORMAL HIGH (ref 4.0–10.5)

## 2013-07-01 MED ORDER — PRENATAL PLUS 27-1 MG PO TABS: 1.0000 | ORAL_TABLET | Freq: Every day | ORAL | Status: DC

## 2013-07-01 MED ORDER — FERROUS SULFATE 325 (65 FE) MG PO TABS: 325.0000 mg | ORAL_TABLET | Freq: Two times a day (BID) | ORAL | Status: DC

## 2013-07-01 NOTE — Lactation Note (Signed)
This note was copied from the chart of Stacy [name]. Lactation Consultation Note Mom BF her last child who is 9 yrs. Old for 7 months. Stated used shells and hand pump to evert nipples more to obtain a deeper latch. Assisted in football hold for deeper latch. Hand expression taught w/small amount to end. Noted skin tags on end of Rt. Nipple. Encouraged massaging breast. Reviewed Baby & Me book's Breastfeeding Basics. Referred to Baby and Me Book in Breastfeeding section Pg. 22-23 for position options and Proper latch demonstration. Mom encouraged to feed baby 8-12 times/24 hours and with feeding cues. Mom encouraged to feed baby w/feeding cues. WH/LC brochure given w/resources, support groups and LC services.Mom reports + breast changes w/pregnancy. Educated about newborn behavior being sleepy and needs to wake baby for feedings and unwrap and place STS for BF. Encouraged comfort during BF so colostrum flows better and mom will enjoy the feeding longer. Taking deep breaths and breast massage during BF. Mom placed in shells and encouraged to wear them between feedings. Encouraged to call for assistance if needed and to verify proper latch. Patient Name: Stacy Elane FritzLiliana [name] ZOXWR'UToday's Date: 07/01/2013 Reason for consult: Initial assessment   Maternal Data Has patient been taught Hand Expression?: Yes Does the patient have breastfeeding experience prior to this delivery?: Yes  Feeding Feeding Type: Breast Fed Length of feed: 0 min  LATCH Score/Interventions Latch: Too sleepy or reluctant, no latch achieved, no sucking elicited. Intervention(s): Teach feeding cues;Waking techniques  Audible Swallowing: None  Type of Nipple: Everted at rest and after stimulation  Comfort (Breast/Nipple): Soft / non-tender     Hold (Positioning): Assistance needed to correctly position infant at breast and maintain latch. Intervention(s): Breastfeeding basics reviewed;Support Pillows;Position  options;Skin to skin  LATCH Score: 5  Lactation Tools Discussed/Used Tools: Shells;Pump Shell Type: Inverted Breast pump type: Manual   Consult Status Consult Status: Follow-up Date: 07/01/13 Follow-up type: In-patient    CARVER, Diamond NickelLAURA G 07/01/2013, 2:50 AM

## 2013-07-01 NOTE — Discharge Instructions (Signed)

## 2013-07-01 NOTE — Progress Notes (Signed)
Ur chart review completed.  

## 2013-07-01 NOTE — Discharge Summary (Signed)
  Obstetric Discharge Summary Reason for Admission: onset of labor Prenatal Procedures: none Intrapartum Procedures: spontaneous vaginal delivery Postpartum Procedures: none Complications-Operative and Postpartum: none  Hemoglobin  Date Value Ref Range Status  07/01/2013 9.0* 12.0 - 15.0 g/dL Final     HCT  Date Value Ref Range Status  07/01/2013 27.5* 36.0 - 46.0 % Final    Physical Exam:  General: alert Lochia: appropriate Uterine: firm Incision: n/a DVT Evaluation: No evidence of DVT seen on physical exam.  Discharge Diagnoses: Active Problems:   Normal labor   NVD (normal vaginal delivery)   Discharge Information: Date: 07/01/2013 Activity: pelvic rest Diet: routine Medications:  Prior to Admission medications   Medication Sig Start Date End Date Taking? Authorizing Provider  ferrous sulfate 325 (65 FE) MG tablet Take 1 tablet (325 mg total) by mouth 2 (two) times daily before a meal. 07/01/13   Stacy CharLisa Jackson-Moore, MD  prenatal vitamin w/FE, FA (PRENATAL 1 + 1) 27-1 MG TABS tablet Take 1 tablet by mouth daily at 12 noon. 07/01/13   Stacy CharLisa Jackson-Moore, MD    Condition: stable Instructions: refer to routine discharge instructions Discharge to: home Follow-up Information   Follow up with Stacy CharJACKSON-MOORE,Stacy A, MD. Schedule an appointment as soon as possible for a visit in 6 weeks.   Specialty:  Obstetrics and Gynecology   Contact information:   [address] [place] [address] [phone]       Newborn Data:  Live born female  Birth Weight: 7 lb 12.3 oz (3525 g) APGAR: 9, 9   Home with mother.  [name],Stacy A 07/01/2013, 8:34 AM

## 2013-07-01 NOTE — Lactation Note (Signed)
This note was copied from the chart of Stacy [name]. Lactation Consultation Note Baby awake and showing feeding cues. Offered to assist mom to latch baby, mom accepts.  Assisted mom to place baby in cross cradle (her preference ) on the left side. Breasts are filling. After several attempts, baby latched with audible swallows. Baby comes off breasts, but latches back on with assistance.  Baby still awake when finished with left side. Enc mom to put baby on the right side, and offered assistance, but mom has many visitors at this time and wants to wait a little while.  Inst mom to use her hand pump and to feed the baby using a spoon or cup whatever milk she pumps. Mom states she understands.   Patient Name: Stacy [name] [id number]'s Date: 07/01/2013 Reason for consult: Follow-up assessment   Maternal Data    Feeding Feeding Type: Breast Fed Length of feed: 18 min  LATCH Score/Interventions Latch: Repeated attempts needed to sustain latch, nipple held in mouth throughout feeding, stimulation needed to elicit sucking reflex.  Audible Swallowing: A few with stimulation  Type of Nipple: Everted at rest and after stimulation  Comfort (Breast/Nipple): Soft / non-tender     Hold (Positioning): Assistance needed to correctly position infant at breast and maintain latch. Intervention(s): Breastfeeding basics reviewed;Support Pillows;Position options;Skin to skin  LATCH Score: 7  Lactation Tools Discussed/Used Breast pump type: Manual   Consult Status Consult Status: Follow-up Follow-up type: In-patient    Octavio MannsSanders, Rutha Melgoza [facility] 07/01/2013, 2:45 PM

## 2013-07-01 NOTE — Lactation Note (Signed)
This note was copied from the chart of Girl Yanely Lira-Rivera. Lactation Consultation Note Mom states baby has been sleepy. Offered to assist with waking baby and feeding, mom accepts. Assisted mom to place baby in football hold, mom comfortable, baby woke up for a minute but did not latch or breastfeed. Baby sound asleep at the breast. Enc mom to call for next feeding if she has any concerns.   Patient Name: Girl Elane FritzLiliana Lira-Rivera ZOXWR'UToday's Date: 07/01/2013 Reason for consult: Follow-up assessment   Maternal Data    Feeding Feeding Type: Breast Fed Length of feed: 4 min  LATCH Score/Interventions Latch: Too sleepy or reluctant, no latch achieved, no sucking elicited. Intervention(s): Waking techniques  Audible Swallowing: None Intervention(s): Hand expression  Type of Nipple: Everted at rest and after stimulation  Comfort (Breast/Nipple): Soft / non-tender     Hold (Positioning): Assistance needed to correctly position infant at breast and maintain latch. Intervention(s): Breastfeeding basics reviewed;Support Pillows;Position options  LATCH Score: 6  Lactation Tools Discussed/Used     Consult Status Consult Status: Follow-up Follow-up type: In-patient    Octavio MannsSanders, Donnamae [place] [facility] 07/01/2013, 12:04 PM

## 2013-07-03 ENCOUNTER — Inpatient Hospital Stay (HOSPITAL_COMMUNITY): Admission: RE | Admit: 2013-07-03 | Payer: PRIVATE HEALTH INSURANCE | Source: Ambulatory Visit

## 2013-08-15 ENCOUNTER — Encounter: Payer: Self-pay | Admitting: Obstetrics & Gynecology

## 2013-08-15 ENCOUNTER — Ambulatory Visit (INDEPENDENT_AMBULATORY_CARE_PROVIDER_SITE_OTHER): Payer: Medicaid Other | Admitting: Obstetrics & Gynecology

## 2013-08-15 NOTE — Progress Notes (Signed)
Patient ID: Stacy [name], female   DOB: 09/29/1980, 33 y.o.   MRN: [id number] Subjective:     Stacy [name] is a 33 y.o. female who presents for a postpartum visit. She is 6 weeks postpartum following a spontaneous vaginal delivery. I have fully reviewed the prenatal and intrapartum course. The delivery was at term . Outcome: spontaneous vaginal delivery.  Postpartum course has been uneventful. Baby's course has been normal. Baby is feeding by breast. Bleeding no bleeding. Bowel function is normal. Bladder function is normal. Patient is not sexually active. Contraception method is rhythm method. Postpartum depression screening: negative.  Tobacco, alcohol and substance abuse history reviewed.  Adult immunizations reviewed including TDAP, rubella and varicella.  The following portions of the patient's history were reviewed and updated as appropriate: allergies, current medications, past family history, past medical history, past social history, past surgical history and problem list.  Review of Systems Pertinent items are noted in HPI.   Objective:    BP 103/73  Pulse 64  Temp(Src) 98 F (36.7 C)  Ht 5\' 2"  (1.575 m)  Wt 69.854 kg (154 lb)  BMI 28.16 kg/m2  Breastfeeding? Yes    General:   alert  Skin:   no rash or abnormalities  Lungs:   clear to auscultation bilaterally  Heart:   regular rate and rhythm, S1, S2 normal, no murmur, click, rub or gallop  Abdomen:  normal findings: no organomegaly, soft, non-tender and no hernia  Pelvis:  External genitalia: normal general appearance Urinary system: urethral meatus normal and bladder without fullness, nontender Vaginal: normal without tenderness, induration or masses Cervix: normal appearance Adnexa: normal bimanual exam Uterus: anteverted and non-tender, normal size         Assessment:     Normal postpartum exam. Pap smear done at today's visit.  Plan:    Contraception: rhythm method  Follow up as needed.  Preconception  counseling provided Healthy lifestyle practices reviewed

## 2013-08-15 NOTE — Patient Instructions (Signed)
Natural Family Planning Natural Family Planning (NFP) is a type of birth control without using any form of contraception. Women who use NFP should not have sexual intercourse when the ovary produces an egg (ovulation) during the menstrual cycle. The NFP method is safe and can prevent pregnancy. It is 75% effective when practiced right. The man needs to also understand this method of birth control and the woman needs to be aware of how her body functions during her menstrual cycle. NFP can also be used as a method of getting pregnant.  HOW THE NFP METHOD WORKS  A woman's menstrual period usually happens every 28-30 days (it can vary from 23-35 days).  Ovulation happens 12-14 days before the start of the next menstrual period (the fertile period). The egg is fertile for 24 hours and the sperm can live for 3 days or more. If there is sexual intercourse at this time, pregnancy can occur. THERE ARE MANY TYPES OF NFP METHODS USED TO PREVENT PREGNANCY  The basal body temperature method. Often times, there is a slight increase of body temperature when a woman ovulates. Take your temperature every morning before getting out of bed. Write the temperature on a chart. An increase in the temperature shows ovulation has happened. Do not have sexual intercourse from the menstrual period up to three days after the increase in the temperature. Note that the body temperature may increase as a result of fever, restless sleep, and working schedules.  The ovulation cervical mucus method. During the menstrual cycle, the cervical mucus changes from dry and sticky to wet and slippery. Check the mucus of the vagina every day to look for these changes. Just before ovulation, the mucus becomes wet and slippery. On the last day of wetness, ovulation happens. To avoid getting pregnant, sexual intercourse is safe for about 10 days after the menstrual period and on the dry mucus days. Do not have sexual intercourse when the mucus  starts to show up and not until 4 days after the wet and slippery mucus goes away. Sexual intercourse after the 4 days have passed until the menstrual period starts is a safe time. Note that the mucus from the vagina can increase because of a vaginal or cervical infection, lubricants, some medicines, and sexual excitement.  The symptothermal method. This method uses both the temperature and the ovulation methods. Combine the two methods above to prevent pregnancy.  The calendar method. Record your menstrual periods and length of the cycles for 6 months. This is helpful when the menstrual cycle varies in the length of the cycle. The length of a menstrual cycle is from day 1 of the present menstrual period to day 1 of the next menstrual period. Then, find your fertile days of the month and do not have sexual intercourse during that time. You may need help from your health care provider to find out your fertile days. There are some signs of ovulation that may be helpful when trying to find the time of ovulation. This includes vaginal spotting or abdominal cramps during the middle of your menstrual cycle. Not all women have these symptoms. YOU SHOULD NOT USE NFP IF:  You have very irregular menstrual periods and may skip months.  You have abnormal bleeding.  You have a vaginal or cervical infection.  You are on medicines that can affect the vaginal mucus or body temperature. These medicines include antibiotics, thyroid medicines, and antihistamines (cold and allergy medicine). Document Released: 06/15/2007 Document Revised: 01/01/2013 Document Reviewed: 06/29/2012   ExitCare Patient Information 2015 [employer]. This information is not intended to replace advice given to you by your health care provider. Make sure you discuss any questions you have with your health care provider.  

## 2013-08-16 LAB — PAP IG W/ RFLX HPV ASCU

## 2013-11-11 ENCOUNTER — Encounter: Payer: Self-pay | Admitting: Obstetrics & Gynecology

## 2014-01-06 ENCOUNTER — Encounter: Payer: Self-pay | Admitting: *Deleted

## 2014-01-07 ENCOUNTER — Encounter: Payer: Self-pay | Admitting: Obstetrics & Gynecology

## 2014-05-06 DIAGNOSIS — R7301 Impaired fasting glucose: Secondary | ICD-10-CM | POA: Insufficient documentation

## 2014-05-12 ENCOUNTER — Telehealth: Payer: Self-pay | Admitting: *Deleted

## 2014-05-12 NOTE — Telephone Encounter (Signed)
Patient was referred back to our office by her PCP requesting an appointment for a Mirena IUD.  Attempted to contact the patient and left message for patient to call the office.

## 2014-05-14 NOTE — Telephone Encounter (Signed)
Patient contacted the office 05-13-14 @ 3:29 pm. Attempted to contact the patient 2:45 pm 05-14-14 and left message for patient to contact the office.

## 2014-05-19 NOTE — Telephone Encounter (Signed)
Contacted patient to schedule IUD appointment. Patient states her last pap was August 2015. Patient states her LMP is 05-10-14. Patient is currently sexually active and using condoms for protection. Patient has been scheduled for 06-11-14. Patient advised no unprotected intercourse before appointment. Patient verbalized understanding

## 2014-06-11 ENCOUNTER — Ambulatory Visit (INDEPENDENT_AMBULATORY_CARE_PROVIDER_SITE_OTHER): Payer: Medicaid Other | Admitting: Certified Nurse Midwife

## 2014-06-11 ENCOUNTER — Encounter: Payer: Self-pay | Admitting: Obstetrics

## 2014-06-11 VITALS — BP 110/76 | HR 62 | Temp 98.4°F | Ht 62.0 in | Wt 156.0 lb

## 2014-06-11 DIAGNOSIS — Z872 Personal history of diseases of the skin and subcutaneous tissue: Secondary | ICD-10-CM

## 2014-06-11 DIAGNOSIS — Z30430 Encounter for insertion of intrauterine contraceptive device: Secondary | ICD-10-CM

## 2014-06-11 DIAGNOSIS — N946 Dysmenorrhea, unspecified: Secondary | ICD-10-CM

## 2014-06-11 LAB — POCT URINE PREGNANCY: Preg Test, Ur: NEGATIVE

## 2014-06-11 MED ORDER — TRANEXAMIC ACID 650 MG PO TABS: 1300.0000 mg | ORAL_TABLET | Freq: Three times a day (TID) | ORAL | Status: DC

## 2014-06-11 NOTE — Progress Notes (Signed)
Patient ID: [name], female   DOB: 12/10/1980, 34 y.o.   MRN: [id number]  IUD Procedure Note   DIAGNOSIS: Desires long-term, reversible contraception.  Had tried Mirena IUD but had it removed due to melasma on upper and lower extremities.  Today contraceptive counseling was given.  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   She stated that her periods usually last only a few days and that she does have severe cramping with her cycles.  Lysteda was given to help with any cramping/bleeding issues since the discussion was to keep her off of hormonal based contraception d/t hx of melasma.    PROCEDURE: IUD placement Performing Provider: Orvilla Cornwallachelle Roran Wegner, CNM  Patient counseled prior to procedure. I explained risks and benefits of Paraguard IUD, reviewed alternative forms of contraception. Patient stated understanding and consented to continue with procedure.   LMP: 06/09/14 Pregnancy Test: Negative Lot #: [id number] Expiration Date: July 2022   IUD type: [   ] Mirena   [X]  Paraguard    PROCEDURE:  Timeout procedure was performed to ensure right patient and right site.  A bimanual exam was performed to determine the position of the uterus, midline. The speculum was placed. The vagina and cervix was sterilized in the usual manner and sterile technique was maintained throughout the course of the procedure. A single toothed tenaculum was not required. The depth of the uterus was sounded to 7 cm. The IUD was inserted to the appropriate depth and inserted without difficulty.  The string was cut to an estimated 4 cm length. Bleeding was minimal. The patient tolerated the procedure well.   Follow up: The patient tolerated the procedure well without complications.  Standard post-procedure care is explained and return precautions are given.  F/U in 1 month for string check.    Amy Wilson SingerWren CNM

## 2014-06-11 NOTE — Patient Instructions (Signed)
Tranexamic acid oral tablets What is this medicine? TRANEXAMIC ACID (TRAN ex AM ik AS id) slows down or stops blood clots from being broken down. This medicine is used to treat heavy monthly menstrual bleeding. This medicine may be used for other purposes; ask your health care provider or pharmacist if you have questions. COMMON BRAND NAME(S): Cyklokapron, Lysteda What should I tell my health care provider before I take this medicine? They need to know if you have any of these conditions: -bleeding in the brain -blood clotting problems -kidney disease -vision problems -an unusual allergic reaction to tranexamic acid, other medicines, foods, dyes, or preservatives -pregnant or trying to get pregnant -breast-feeding How should I use this medicine? Take this medicine by mouth with a glass of water. Follow the directions on the prescription label. Do not cut, crush, or chew this medicine. You can take it with or without food. If it upsets your stomach, take it with food. Take your medicine at regular intervals. Do not take it more often than directed. Do not stop taking except on your doctor's advice. Do not take this medicine until your period has started. Do not take it for more than 5 days in a row. Do not take this medicine when you do not have your period. Talk to your pediatrician regarding the use of this medicine in children. While this drug may be prescribed for female children as young as 12 years of age for selected conditions, precautions do apply. Overdosage: If you think you've taken too much of this medicine contact a poison control center or emergency room at once. Overdosage: If you think you have taken too much of this medicine contact a poison control center or emergency room at once. NOTE: This medicine is only for you. Do not share this medicine with others. What if I miss a dose? If you miss a dose, take it when you remember, and then take your next dose at least 6 hours  later. Do not take more than 2 tablets at a time to make up for missed doses. What may interact with this medicine? Do not take this medicine with any of the following medications: -female hormones, like estrogens or progestins and birth control pills, patches, rings, or injections This medicine may also interact with the following medications: -certain medicines used to help your blood clot or break up blood clots -certain medicines used to treat leukemia This list may not describe all possible interactions. Give your health care provider a list of all the medicines, herbs, non-prescription drugs, or dietary supplements you use. Also tell them if you smoke, drink alcohol, or use illegal drugs. Some items may interact with your medicine. What should I watch for while using this medicine? Tell your doctor or healthcare professional if your symptoms do not start to get better or if they get worse. Tell your doctor or healthcare professional if you notice any eye problems while taking this medicine. Your doctor will refer you to an eye doctor who will examine your eyes. What side effects may I notice from receiving this medicine? Side effects that you should report to your doctor or health care professional as soon as possible: -allergic reactions like skin rash, itching or hives, swelling of the face, lips, or tongue -breathing difficulties -changes in vision -sudden or severe pain in the chest, legs, head, or groin -unusually weak or tired Side effects that usually do not require medical attention (Report these to your doctor or health care professional if   they continue or are bothersome.): -back pain -headache -muscle or joint aches -sinus and nasal problems -stomach pain -tiredness This list may not describe all possible side effects. Call your doctor for medical advice about side effects. You may report side effects to FDA at 1-800-FDA-1088. Where should I keep my medicine? Keep out of  the reach of children. Store at room temperature between 15 and 30 degrees C (59 and 86 degrees F). Throw away any unused medicine after the expiration date. NOTE: This sheet is a summary. It may not cover all possible information. If you have questions about this medicine, talk to your doctor, pharmacist, or health care provider.  2015, Elsevier/Gold Standard. (2011-12-12 17:45:19)  

## 2014-07-09 ENCOUNTER — Ambulatory Visit: Payer: Medicaid Other | Admitting: Certified Nurse Midwife

## 2014-07-15 ENCOUNTER — Encounter: Payer: Self-pay | Admitting: Certified Nurse Midwife

## 2014-07-15 ENCOUNTER — Ambulatory Visit (INDEPENDENT_AMBULATORY_CARE_PROVIDER_SITE_OTHER): Payer: Medicaid Other | Admitting: Certified Nurse Midwife

## 2014-07-15 VITALS — BP 100/64 | HR 58 | Temp 98.1°F | Wt 147.0 lb

## 2014-07-15 DIAGNOSIS — Z30431 Encounter for routine checking of intrauterine contraceptive device: Secondary | ICD-10-CM | POA: Diagnosis not present

## 2014-07-15 NOTE — Progress Notes (Signed)
Patient ID: Stacy [name], female   DOB: 12/22/1980, 34 y.o.   MRN: [id number]    Chief Complaint  Patient presents with  . Follow-up    IUD-string check    HPI Stacy [name] is a 34 y.o. female.  Here for f/u on Paraguard insertion, string check.  States period was improved with paraguard and is liking this IUD better than the Mirena.  No problems with the paraguard so far.     HPI  Past Medical History  Diagnosis Date  . Medical history non-contributory     Past Surgical History  Procedure Laterality Date  . Multiple tooth extractions Bilateral     for orthodontic care    Family History  Problem Relation Age of Onset  . Diabetes Mother   . Hyperlipidemia Mother   . Diabetes Father     Social History History  Substance Use Topics  . Smoking status: Former Smoker    Quit date: 06/28/2001  . Smokeless tobacco: Never Used  . Alcohol Use: 0.0 oz/week    0 Standard drinks or equivalent per week     Comment: Ocassionally    No Known Allergies  Current Outpatient Prescriptions  Medication Sig Dispense Refill  . ferrous sulfate 325 (65 FE) MG tablet Take 1 tablet (325 mg total) by mouth 2 (two) times daily before a meal. (Patient not taking: Reported on 06/11/2014) 60 tablet 11  . prenatal vitamin w/FE, FA (PRENATAL 1 + 1) 27-1 MG TABS tablet Take 1 tablet by mouth daily at 12 noon. (Patient not taking: Reported on 07/15/2014) 30 each 0  . tranexamic acid (LYSTEDA) 650 MG TABS tablet Take 2 tablets (1,300 mg total) by mouth 3 (three) times daily. For the first 5 days of your period. (Patient not taking: Reported on 07/15/2014) 30 tablet 3   No current facility-administered medications for this visit.    Review of Systems Review of Systems Constitutional: negative for fatigue and weight loss Respiratory: negative for cough and wheezing Cardiovascular: negative for chest pain, fatigue and palpitations Gastrointestinal: negative for abdominal pain and change in bowel  habits Genitourinary:negative Integument/breast: negative for nipple discharge Musculoskeletal:negative for myalgias Neurological: negative for gait problems and tremors Behavioral/Psych: negative for abusive relationship, depression Endocrine: negative for temperature intolerance     Blood pressure 100/64, pulse 58, temperature 98.1 F (36.7 C), weight 147 lb (66.679 kg), last menstrual period 07/08/2014, currently breastfeeding.  Physical Exam Physical Exam General:   alert  Skin:   no rash or abnormalities  Lungs:   clear to auscultation bilaterally  Heart:   regular rate and rhythm, S1, S2 normal, no murmur, click, rub or gallop  Breasts:   deferred  Abdomen:  normal findings: no organomegaly, soft, non-tender and no hernia  Pelvis:  External genitalia: normal general appearance Urinary system: urethral meatus normal and bladder without fullness, nontender Vaginal: normal without tenderness, induration or masses Cervix: normal appearance, strings present Adnexa: normal bimanual exam Uterus: anteverted and non-tender, normal size    50% of 15 min visit spent on counseling and coordination of care.   Data Reviewed Previous medical hx, labs, meds   Assessment     Strings in place     Plan    No orders of the defined types were placed in this encounter.   No orders of the defined types were placed in this encounter.     Follow up in one year for annual exam.

## 2017-05-17 ENCOUNTER — Encounter: Payer: Self-pay | Admitting: *Deleted

## 2017-05-17 ENCOUNTER — Encounter: Payer: Self-pay | Admitting: Certified Nurse Midwife

## 2017-05-17 ENCOUNTER — Ambulatory Visit: Payer: BC Managed Care – PPO | Admitting: Certified Nurse Midwife

## 2017-05-17 VITALS — BP 108/74 | HR 75 | Ht 61.0 in | Wt 143.0 lb

## 2017-05-17 DIAGNOSIS — Z30432 Encounter for removal of intrauterine contraceptive device: Secondary | ICD-10-CM

## 2017-05-17 DIAGNOSIS — L309 Dermatitis, unspecified: Secondary | ICD-10-CM

## 2017-05-17 MED ORDER — TRIAMCINOLONE ACETONIDE 0.5 % EX OINT: 1.0000 "application " | TOPICAL_OINTMENT | Freq: Two times a day (BID) | CUTANEOUS | 2 refills | Status: DC

## 2017-05-17 NOTE — Progress Notes (Signed)
MIRENA REMOVAL   Reasons for removal:  Desires pregnancy.  Is  having normal periods with Paragard.     A timeout was performed confirming the patient, the procedure and allergy status. The patient was placed in the lithotomy position, a speculum was placed.  Cervix and strings visualized.   Long forceps used in a strile manner.  Stings grasped with long forceps and device removed intact.  The patient tolerated the procedure well.  New contraceptive method: no method, natural family planning (NFP).  Prenatal vitamins given to the patient.    Last pap smear: 03/20/15: ASCUS

## 2017-05-17 NOTE — Addendum Note (Signed)
Addended by: Marjo Bicker, RACHELLE A on: 05/17/2017 10:00 AM   Modules accepted: Orders

## 2017-05-17 NOTE — Progress Notes (Signed)
Presents for IUD removal, she wants to conceive.

## 2017-09-25 ENCOUNTER — Telehealth: Payer: Self-pay

## 2017-09-25 NOTE — Telephone Encounter (Signed)
TC from pt [redacted]wks pregnant states she has had bleeding since last Tuesday. Denies any pain, not soaking a pad.  Pt advised to got to [facility] for an evaluation Pt voiced concerns about cost and ectopic pregnancy. I let her know I could not determine that and she would need to go to [facility].

## 2017-11-02 ENCOUNTER — Ambulatory Visit (HOSPITAL_COMMUNITY)
Admission: RE | Admit: 2017-11-02 | Discharge: 2017-11-02 | Disposition: A | Discharge status: Home / Self Care | Payer: BC Managed Care – PPO | Source: Ambulatory Visit | Attending: Certified Nurse Midwife | Admitting: Certified Nurse Midwife

## 2017-11-02 ENCOUNTER — Ambulatory Visit (INDEPENDENT_AMBULATORY_CARE_PROVIDER_SITE_OTHER): Payer: BC Managed Care – PPO | Admitting: Certified Nurse Midwife

## 2017-11-02 ENCOUNTER — Encounter: Payer: Self-pay | Admitting: Certified Nurse Midwife

## 2017-11-02 DIAGNOSIS — Z3A01 Less than 8 weeks gestation of pregnancy: Secondary | ICD-10-CM | POA: Diagnosis not present

## 2017-11-02 DIAGNOSIS — Z3481 Encounter for supervision of other normal pregnancy, first trimester: Secondary | ICD-10-CM

## 2017-11-02 DIAGNOSIS — O09521 Supervision of elderly multigravida, first trimester: Secondary | ICD-10-CM

## 2017-11-02 DIAGNOSIS — O09529 Supervision of elderly multigravida, unspecified trimester: Secondary | ICD-10-CM | POA: Insufficient documentation

## 2017-11-02 DIAGNOSIS — O0990 Supervision of high risk pregnancy, unspecified, unspecified trimester: Secondary | ICD-10-CM

## 2017-11-02 DIAGNOSIS — Z3480 Encounter for supervision of other normal pregnancy, unspecified trimester: Secondary | ICD-10-CM

## 2017-11-02 HISTORY — DX: Supervision of elderly multigravida, unspecified trimester: O09.529

## 2017-11-02 HISTORY — DX: Supervision of high risk pregnancy, unspecified, unspecified trimester: O09.90

## 2017-11-02 NOTE — Progress Notes (Signed)
Subjective:   Stacy [name] is a 37 y.o. G3P2002 at [redacted]w[redacted]d by LMP being seen today for her first obstetrical visit.  Her obstetrical history is significant for advanced maternal age. Patient does intend to breast feed. Pregnancy history fully reviewed.  Patient reports no complaints.  HISTORY: OB History  Gravida Para Term Preterm AB Living  3 2 2  0 0 2  SAB TAB Ectopic Multiple Live Births  0 0 0 0 2    # Outcome Date GA Lbr Len/2nd Weight Sex Delivery Anes PTL Lv  3 Current           2 Term 06/30/13 [redacted]w[redacted]d 17:57 / 00:15 7 lb 12.3 oz (3.525 kg) F Vag-Spont EPI  LIV     Name: VENESHA, PETRAITIS     Apgar1: 9  Apgar5: 9  1 Term 04/20/04 [redacted]w[redacted]d  8 lb (3.629 kg) F Vag-Spont EPI N LIV     Name: [place]    Last pap smear was done last year at PCP per patient- no records in Epic   Past Medical History:  Diagnosis Date  . Medical history non-contributory    Past Surgical History:  Procedure Laterality Date  . MULTIPLE TOOTH EXTRACTIONS Bilateral    for orthodontic care   Family History  Problem Relation Age of Onset  . Diabetes Mother   . Hyperlipidemia Mother   . Diabetes Father    Social History   Tobacco Use  . Smoking status: Former Smoker    Last attempt to quit: 06/28/2001    Years since quitting: 16.3  . Smokeless tobacco: Never Used  Substance Use Topics  . Alcohol use: Yes    Alcohol/week: 0.0 standard drinks    Comment: Ocassionally  . Drug use: No   No Known Allergies Current Outpatient Medications on File Prior to Visit  Medication Sig Dispense Refill  . Omega-3-6-9 CAPS Take by mouth.    . prenatal vitamin w/FE, FA (PRENATAL 1 + 1) 27-1 MG TABS tablet Take 1 tablet by mouth daily at 12 noon. 30 each 0  . SPIRULINA PO Take by mouth.    . ferrous sulfate 325 (65 FE) MG tablet Take 1 tablet (325 mg total) by mouth 2 (two) times daily before a meal. (Patient not taking: Reported on 06/11/2014) 60 tablet 11  . tranexamic acid (LYSTEDA) 650 MG  TABS tablet Take 2 tablets (1,300 mg total) by mouth 3 (three) times daily. For the first 5 days of your period. (Patient not taking: Reported on 07/15/2014) 30 tablet 3  . triamcinolone ointment (KENALOG) 0.5 % Apply 1 application topically 2 (two) times daily. 60 g 2   No current facility-administered medications on file prior to visit.     Review of Systems Pertinent items noted in HPI and remainder of comprehensive ROS otherwise negative.  Exam   System: General: well-developed, well-nourished female in no acute distress   Skin: normal coloration and turgor, no rashes   Neurologic: oriented, normal, negative, normal mood   Extremities: normal strength, tone, and muscle mass, ROM of all joints is normal   HEENT PERRLA, extraocular movement intact and sclera clear, anicteric   Mouth/Teeth mucous membranes moist, pharynx normal without lesions and dental hygiene good   Neck supple and no masses   Cardiovascular: regular rate and rhythm   Respiratory:  no respiratory distress, normal breath sounds   Abdomen: soft, non-tender; bowel sounds normal; no masses,  no organomegaly   Bedside Ultrasound for FHR check:  Patient informed that the ultrasound is considered a limited obstetric ultrasound and is not intended to be a complete ultrasound exam.  Patient also informed that the ultrasound is not being completed with the intent of assessing for fetal or placental anomalies or any pelvic abnormalities.  Explained that the purpose of today's ultrasound is to assess for fetal heart rate.  Patient acknowledges the purpose of the exam and the limitations of the study.  No fetus seen on Bedside abdominal US- only IUGS noted Assessment:   Pregnancy: [id number] Patient Active Problem List   Diagnosis Date Noted  . Supervision of other normal pregnancy, antepartum 11/02/2017  . AMA (advanced maternal age) multigravida 35+ 11/02/2017     Plan:  1. Supervision of other normal pregnancy, antepartum -  Patient reports LMP being in August, she then had vaginal spotting September 10th- 16th, took additional pregnancy test two weeks after bleeding that was still positive  - No embryo seen on bedside US, patient sent to radiology for transvaginal assessment and to rule out possible ectopic due to difficult visualization of ovaries  - Beta hCG quant (ref lab) - US OB LESS THAN 14 WEEKS WITH OB TRANSVAGINAL Problem list reviewed and updated. Will call patient with results of [place] and discuss management   - [place] report reviewed  US Ob Less Than 14 Weeks With Ob Transvaginal  Result Date: 11/02/2017 CLINICAL DATA:  First trimester pregnancy with inconclusive fetal viability. EXAM: OBSTETRIC <14 WK [place] AND TRANSVAGINAL OB US TECHNIQUE: Both transabdominal and transvaginal ultrasound examinations were performed for complete evaluation of the gestation as well as the maternal uterus, adnexal regions, and pelvic cul-de-sac. Transvaginal technique was performed to assess early pregnancy. COMPARISON:  None. FINDINGS: Intrauterine gestational sac: Single Yolk sac:  Visualized. Embryo:  Not Visualized MSD: 14 mm   6 w   1 d Subchorionic hemorrhage:  None visualized. Maternal uterus/adnexae: Normal appearance of both ovaries. Small left ovarian corpus luteum cyst noted. No adnexal mass or abnormal free fluid identified. IMPRESSION: Single intrauterine gestational sac measuring 6 weeks 1 day by mean sac diameter. Consider following b-hCG levels, and followup ultrasound to assess viability in 10-14 days. Electronically Signed   By: Myles Rosenthal M.D.   On: 11/02/2017 14:46   Patient called and aware, New OB appointment to be rescheduled for 4-5 weeks from today.    Sharyon Cable, CNM [facility] [employer]

## 2017-11-02 NOTE — Progress Notes (Signed)
Pt presents for NOB visit. This is a planned pregnancy.  Pt wishes to defer Panorama until next year for insurance purposes.  Flu vaccine offered; pt declined at this time.

## 2017-11-03 LAB — BETA HCG QUANT (REF LAB): hCG Quant: 18721 m[IU]/mL

## 2017-12-06 ENCOUNTER — Ambulatory Visit (INDEPENDENT_AMBULATORY_CARE_PROVIDER_SITE_OTHER): Payer: BC Managed Care – PPO | Admitting: Advanced Practice Midwife

## 2017-12-06 ENCOUNTER — Encounter: Payer: Self-pay | Admitting: Advanced Practice Midwife

## 2017-12-06 VITALS — BP 119/68 | HR 71 | Wt 154.0 lb

## 2017-12-06 DIAGNOSIS — Z3480 Encounter for supervision of other normal pregnancy, unspecified trimester: Secondary | ICD-10-CM

## 2017-12-06 DIAGNOSIS — Z113 Encounter for screening for infections with a predominantly sexual mode of transmission: Secondary | ICD-10-CM

## 2017-12-06 DIAGNOSIS — Z3482 Encounter for supervision of other normal pregnancy, second trimester: Secondary | ICD-10-CM

## 2017-12-06 DIAGNOSIS — O09529 Supervision of elderly multigravida, unspecified trimester: Secondary | ICD-10-CM

## 2017-12-06 DIAGNOSIS — O09522 Supervision of elderly multigravida, second trimester: Secondary | ICD-10-CM

## 2017-12-06 NOTE — Progress Notes (Signed)
   PRENATAL VISIT NOTE  Subjective:  Stacy [name] is a 37 y.o. G3P2002 at 3065w5d being seen today for initial prenatal visit.  She is currently monitored for the following issues for this low-risk pregnancy and has Supervision of other normal pregnancy, antepartum and AMA (advanced maternal age) multigravida 35+ on their problem list.  Patient reports fatigue.   . Vag. Bleeding: None.   . Denies leaking of fluid.   The following portions of the patient's history were reviewed and updated as appropriate: allergies, current medications, past family history, past medical history, past social history, past surgical history and problem list. Problem list updated.  Objective:   Vitals:   12/06/17 0903  BP: 119/68  Pulse: 71  Weight: 69.9 kg    Fetal Status: Fetal Heart Rate (bpm): 160        VS reviewed, nursing note reviewed,  Constitutional: well developed, well nourished, no distress HEENT: normocephalic CV: normal rate Pulm/chest wall: normal effort Breast Exam:  right breast normal without mass, skin or nipple changes or axillary nodes, left breast normal without mass, skin or nipple changes or axillary nodes Abdomen: soft Neuro: alert and oriented x 3 Skin: warm, dry Psych: affect normal Pelvic exam: Vaginal cultures collected by blind swab. Pap records from 2018 requested.  Assessment and Plan:  Pregnancy: G3P2002 at 6665w5d  1. Supervision of other normal pregnancy, antepartum --Anticipatory guidance about next visits/weeks of pregnancy given. --Discussed and offered genetic screening options, including Quad screen/AFP, NIPS testing, and option to decline testing. Benefits/risks/alternatives reviewed. Pt aware that anatomy US is form of genetic screening with lower accuracy in detecting trisomies than blood work.  Pt would like NIPS but will contact her insurance to talk about coverage. --Taking PNV --[facility], with teaching service including midwives,  family practice fellows, family practice residents and students. Questions answered.  Preterm labor symptoms and general obstetric precautions including but not limited to vaginal bleeding, contractions, leaking of fluid and fetal movement were reviewed in detail with the patient. Please refer to After Visit Summary for other counseling recommendations.  Return in about 4 weeks (around 01/03/2018).  Future Appointments  Date Time Provider Department Center  01/01/2018  3:30 PM Leftwich-Kirby, Wilmer FloorLisa A, CNM [facility] None    Sharen CounterLisa Leftwich-Kirby, CNM

## 2017-12-06 NOTE — Progress Notes (Signed)
DATING AND VIABILITY SONOGRAM   Stacy MiniumLiliana Zettie CooleyLira Rivera is a 37 y.o. year old 543P2002 with LMP Patient's last menstrual period was 08/18/2017. which would correlate to  3169w0d weeks gestation.  She has regular menstrual cycles.   She is here today for a confirmatory initial sonogram.    GESTATION: SINGLETON     FETAL ACTIVITY:          Heart rate      160          The fetus is active.   GESTATIONAL AGE AND  BIOMETRICS:  Gestational criteria: Estimated Date of Delivery: 06/27/18 by early ultrasound now at 2169w0d  Previous Scans:1      CROWN RUMP LENGTH                11 weeks                                                                               AVERAGE EGA(BY THIS SCAN):  11 weeks  WORKING EDD( early ultrasound ):  06-27-2018     TECHNICIAN COMMENTS:  Used bedside ultrasound.  Armandina StammerJennifer Howard 12/06/2017 9:28 AM

## 2017-12-06 NOTE — Patient Instructions (Addendum)

## 2017-12-08 LAB — CERVICOVAGINAL ANCILLARY ONLY
Chlamydia: NEGATIVE
Neisseria Gonorrhea: NEGATIVE

## 2017-12-08 LAB — URINE CULTURE

## 2017-12-09 LAB — OBSTETRIC PANEL, INCLUDING HIV
Basophils Absolute: 0 10*3/uL (ref 0.0–0.2)
Basos: 0 %
EOS (ABSOLUTE): 0.1 10*3/uL (ref 0.0–0.4)
Eos: 1 %
HIV Screen 4th Generation wRfx: NONREACTIVE
Hematocrit: 38.3 % (ref 34.0–46.6)
Hemoglobin: 12.7 g/dL (ref 11.1–15.9)
Hepatitis B Surface Ag: NEGATIVE
Immature Grans (Abs): 0 10*3/uL (ref 0.0–0.1)
Immature Granulocytes: 0 %
Lymphocytes Absolute: 1.6 10*3/uL (ref 0.7–3.1)
Lymphs: 22 %
MCH: 27.7 pg (ref 26.6–33.0)
MCHC: 33.2 g/dL (ref 31.5–35.7)
MCV: 83 fL (ref 79–97)
Monocytes Absolute: 0.4 10*3/uL (ref 0.1–0.9)
Monocytes: 6 %
Neutrophils Absolute: 5.1 10*3/uL (ref 1.4–7.0)
Neutrophils: 71 %
Platelets: 336 10*3/uL (ref 150–450)
RBC: 4.59 x10E6/uL (ref 3.77–5.28)
RDW: 13.7 % (ref 12.3–15.4)
RPR Ser Ql: NONREACTIVE
Rh Factor: POSITIVE
Rubella Antibodies, IGG: 1.38 index (ref >0.99)
WBC: 7.3 10*3/uL (ref 3.4–10.8)

## 2017-12-09 LAB — AB SCR+ANTIBODY ID

## 2018-01-01 ENCOUNTER — Ambulatory Visit (INDEPENDENT_AMBULATORY_CARE_PROVIDER_SITE_OTHER): Payer: BC Managed Care – PPO | Admitting: Advanced Practice Midwife

## 2018-01-01 VITALS — BP 112/72 | HR 86 | Wt 159.2 lb

## 2018-01-01 DIAGNOSIS — Z3480 Encounter for supervision of other normal pregnancy, unspecified trimester: Secondary | ICD-10-CM

## 2018-01-01 DIAGNOSIS — Z3482 Encounter for supervision of other normal pregnancy, second trimester: Secondary | ICD-10-CM

## 2018-01-01 NOTE — Patient Instructions (Signed)

## 2018-01-01 NOTE — Progress Notes (Signed)
   PRENATAL VISIT NOTE  Subjective:  Stacy [name] is a 37 y.o. G3P2002 at 1836w5d being seen today for ongoing prenatal care.  She is currently monitored for the following issues for this low-risk pregnancy and has Supervision of other normal pregnancy, antepartum and AMA (advanced maternal age) multigravida 35+ on their problem list.  Patient reports no complaints.  Contractions: Not present. Vag. Bleeding: None.  Movement: Present. Denies leaking of fluid.   The following portions of the patient's history were reviewed and updated as appropriate: allergies, current medications, past family history, past medical history, past social history, past surgical history and problem list. Problem list updated.  Objective:   Vitals:   01/01/18 1545  BP: 112/72  Pulse: 86  Weight: 72.2 kg    Fetal Status: Fetal Heart Rate (bpm): 165   Movement: Present     General:  Alert, oriented and cooperative. Patient is in no acute distress.  Skin: Skin is warm and dry. No rash noted.   Cardiovascular: Normal heart rate noted  Respiratory: Normal respiratory effort, no problems with respiration noted  Abdomen: Soft, gravid, appropriate for gestational age.  Pain/Pressure: Absent     Pelvic: Cervical exam deferred        Extremities: Normal range of motion.  Edema: None  Mental Status: Normal mood and affect. Normal behavior. Normal judgment and thought content.   Assessment and Plan:  Pregnancy: G3P2002 at 2336w5d  1. Supervision of other normal pregnancy, antepartum --Anticipatory guidance about next visits/weeks of pregnancy given. --Anatomy US ordered --NIPS today   Preterm labor symptoms and general obstetric precautions including but not limited to vaginal bleeding, contractions, leaking of fluid and fetal movement were reviewed in detail with the patient. Please refer to After Visit Summary for other counseling recommendations.  Return in about 4 weeks (around 01/29/2018).  Future  Appointments  Date Time Provider Department Center  01/05/2018  8:15 AM [facility] None  01/29/2018  3:30 PM Leftwich-Kirby, Wilmer FloorLisa A, CNM [facility] None    Sharen CounterLisa Leftwich-Kirby, CNM

## 2018-01-01 NOTE — Progress Notes (Signed)
Patient reports fetal flutter movements, denies pain.

## 2018-01-05 ENCOUNTER — Other Ambulatory Visit: Payer: BC Managed Care – PPO

## 2018-01-05 DIAGNOSIS — Z3480 Encounter for supervision of other normal pregnancy, unspecified trimester: Secondary | ICD-10-CM

## 2018-01-06 LAB — GLUCOSE TOLERANCE, 2 HOURS W/ 1HR
Glucose, 1 hour: 184 mg/dL — ABNORMAL HIGH (ref 65–179)
Glucose, 2 hour: 161 mg/dL — ABNORMAL HIGH (ref 65–152)
Glucose, Fasting: 94 mg/dL — ABNORMAL HIGH (ref 65–91)

## 2018-01-09 ENCOUNTER — Telehealth: Payer: Self-pay

## 2018-01-09 DIAGNOSIS — Z3480 Encounter for supervision of other normal pregnancy, unspecified trimester: Secondary | ICD-10-CM

## 2018-01-09 LAB — AFP, SERUM, OPEN SPINA BIFIDA
AFP MoM: 0.6
AFP Value: 17.4 ng/mL
Gest. Age on Collection Date: 15.3 weeks
Maternal Age At EDD: 36.7 yr
OSBR Risk 1 IN: 10000
Test Results:: NEGATIVE
Weight: 159 [lb_av]

## 2018-01-09 NOTE — Telephone Encounter (Signed)
Attempted to contact pt about early 2hr results. No answer, left vm, placed referral to GDM education.  Need pt to check with insurance to see what diabetic supplies they cover, and will send.

## 2018-01-09 NOTE — Telephone Encounter (Signed)
Pt returned call, advised of 2hr results, referral and pt stated that she would check with insurance for diabetic supplies covered and let us know.

## 2018-01-10 NOTE — L&D Delivery Note (Addendum)
[facility] Delivery Note  Stacy [name] is a 38 y.o. G3P2002 s/p induced vaginal at [redacted]w[redacted]d. She was admitted for IOL.   GBS Status: negative Maximum Maternal Temperature: Temp (24hrs), Avg:98.4 F (36.9 C), Min:98 F (36.7 C), Max:99 F (37.2 C)  Labor Progress: . Admitted for IOL . Cytotec & FB placed, followed by pitocin  . AROM 2h 64m prior to delivery with clear fluid  . Pitocin until complete dilation achieved.   Delivery Date/Time: 06/22/2018 at 2123 Delivery: Called to room and patient was complete and pushing. Head delivered OA;LOA. Nuchal cord present x1. Shoulder and body delivered in usual fashion. Infant with spontaneous cry, placed on mother's abdomen, dried and stimulated. Cord clamped x 2 after 1-minute delay, and cut by FOB. Cord blood drawn. Placenta delivered spontaneously with gentle cord traction. Fundus firm with massage and Pitocin. Labia, perineum, vagina, and cervix inspected with right periurethral 1st degree and 1st degree perineal.   Placenta: spontaneous , intact  Complications: none Lacerations:   3.0 vicryl EBL: 75 mL  Analgesia:  Epidural   Postpartum Planning Mom to MB.  Baby to MB. . Lactation consult, breast  . AM CBC . Contraception POPs  . Circ no   Infant: Viable female  APGAR:9/9  3600g Zettie Cooley, M.D.  06/22/2018 4:26 AM  The above was performed under my direct supervision and guidance.

## 2018-01-11 ENCOUNTER — Encounter: Payer: Self-pay | Admitting: Advanced Practice Midwife

## 2018-01-11 DIAGNOSIS — O24415 Gestational diabetes mellitus in pregnancy, controlled by oral hypoglycemic drugs: Secondary | ICD-10-CM

## 2018-01-11 HISTORY — DX: Gestational diabetes mellitus in pregnancy, controlled by oral hypoglycemic drugs: O24.415

## 2018-01-12 ENCOUNTER — Encounter: Payer: Self-pay | Admitting: Registered"

## 2018-01-12 ENCOUNTER — Telehealth: Payer: Self-pay

## 2018-01-12 ENCOUNTER — Encounter: Payer: BC Managed Care – PPO | Attending: Advanced Practice Midwife | Admitting: Registered"

## 2018-01-12 ENCOUNTER — Other Ambulatory Visit: Payer: Self-pay

## 2018-01-12 DIAGNOSIS — O24419 Gestational diabetes mellitus in pregnancy, unspecified control: Secondary | ICD-10-CM

## 2018-01-12 DIAGNOSIS — O99810 Abnormal glucose complicating pregnancy: Secondary | ICD-10-CM | POA: Insufficient documentation

## 2018-01-12 MED ORDER — GLUCOSE BLOOD VI STRP: ORAL_STRIP | 12 refills | Status: DC

## 2018-01-12 MED ORDER — ONETOUCH DELICA LANCETS 33G MISC
1.0000 | 10 refills | Status: DC | PRN
Start: 2018-01-12 — End: 2018-01-29

## 2018-01-12 NOTE — Progress Notes (Signed)
Pt called and states that she needs specific strips and lancets for her glucose meter per her insurance.

## 2018-01-12 NOTE — Telephone Encounter (Signed)
Patient called and states that her insurance will only cover the One Touch meter to check her blood glucose, but the device will not be able to be delivered to her until 01/26/18. The patient was wondering should she try to pay out of pocket for another device to use until then or is she okay to not start checking her BG levels until the 17th.

## 2018-01-12 NOTE — Progress Notes (Signed)
Patient was seen on 01/12/18 for Gestational Diabetes self-management education at the [facility]. The following learning objectives were met by the patient during this course:   States the definition of Gestational Diabetes  States why dietary management is important in controlling blood glucose  Describes the effects each nutrient has on blood glucose levels  Demonstrates ability to create a balanced meal plan  Demonstrates carbohydrate counting   States when to check blood glucose levels  Demonstrates proper blood glucose monitoring techniques  States the effect of stress and exercise on blood glucose levels  States the importance of limiting caffeine and abstaining from alcohol and smoking  Blood glucose monitor given: Contour Next Lot # [id number] Exp: 09/10/2018 Blood glucose reading: 137 mg/dL  Patient instructed to monitor glucose levels: FBS: 60 - <95; 1 hour: <140; 2 hour: <120  Patient received handouts:  Nutrition Diabetes and Pregnancy, including carb counting list  Patient will be seen for follow-up as needed.

## 2018-01-29 ENCOUNTER — Ambulatory Visit (INDEPENDENT_AMBULATORY_CARE_PROVIDER_SITE_OTHER): Payer: BC Managed Care – PPO | Admitting: Advanced Practice Midwife

## 2018-01-29 VITALS — BP 112/76 | HR 80 | Wt 156.0 lb

## 2018-01-29 DIAGNOSIS — Z23 Encounter for immunization: Secondary | ICD-10-CM

## 2018-01-29 DIAGNOSIS — Z3480 Encounter for supervision of other normal pregnancy, unspecified trimester: Secondary | ICD-10-CM

## 2018-01-29 DIAGNOSIS — Z3482 Encounter for supervision of other normal pregnancy, second trimester: Secondary | ICD-10-CM

## 2018-01-29 DIAGNOSIS — O24419 Gestational diabetes mellitus in pregnancy, unspecified control: Secondary | ICD-10-CM

## 2018-01-29 MED ORDER — GLUCOSE BLOOD VI STRP: ORAL_STRIP | 6 refills | Status: DC

## 2018-01-29 MED ORDER — ONETOUCH DELICA LANCETS 30G MISC: 1.0000 | Freq: Four times a day (QID) | 6 refills | Status: DC

## 2018-01-29 NOTE — Patient Instructions (Signed)
Gestational Diabetes Mellitus, Self Care  Caring for yourself after you have been diagnosed with gestational diabetes (gestational diabetes mellitus) means keeping your blood sugar (glucose) under control. You can do that with a balance of:   Nutrition.   Exercise.   Lifestyle changes.   Medicines or insulin, if necessary.   Support from your team of health care providers and others.  The following information explains what you need to know to manage your gestational diabetes at home.  What are the risks?  If gestational diabetes is treated, it is unlikely to cause problems. If it is not controlled with treatment, it may cause problems during labor and delivery, and some of those problems can be harmful to the unborn baby (fetus) and the mother. Uncontrolled gestational diabetes may also cause the newborn baby to have breathing problems and low blood glucose.  Women who get gestational diabetes are more likely to develop it if they get pregnant again, and they are more likely to develop type 2 diabetes in the future.  How to monitor blood glucose     Check your blood glucose every day during your pregnancy. Do this as often as told by your health care provider.   Contact your health care provider if your blood glucose is above your target for two tests in a row.  Your health care provider will set individualized treatment goals for you. Generally, the goal of treatment is to maintain the following blood glucose levels during pregnancy:   Before meals (preprandial): at or below 95 mg/dL (5.3 mmol/L).   After meals (postprandial):  ? One hour after a meal: at or below 140 mg/dL (7.8 mmol/L).  ? Two hours after a meal: at or below 120 mg/dL (6.7 mmol/L).   A1c (hemoglobin A1c) level: 6-6.5%.  How to manage hyperglycemia and hypoglycemia  Hyperglycemia symptoms  Hyperglycemia, also called high blood glucose, occurs when blood glucose is too high. Make sure you know the early signs of hyperglycemia, such  as:   Increased thirst.   Hunger.   Feeling very tired.   Needing to urinate more often than usual.   Blurry vision.  Hypoglycemia symptoms  Hypoglycemia, also called low blood glucose, occurs with a blood glucose level at or below 70 mg/dL (3.9 mmol/L). The risk for hypoglycemia increases during or after exercise, during sleep, during illness, and when skipping meals or not eating for a long time (fasting). Symptoms may include:   Hunger.   Anxiety.   Sweating and feeling clammy.   Confusion.   Dizziness or feeling light-headed.   Sleepiness.   Nausea.   Increased heart rate.   Headache.   Blurry vision.   Irritability.   Tingling or numbness around the mouth, lips, or tongue.   A change in coordination.   Restless sleep.   Fainting.   Seizure.  It is important to know the symptoms of hypoglycemia and treat it right away. Always have a 15-gram rapid-acting carbohydrate snack with you to treat low blood glucose. Family members and close friends should also know the symptoms and should understand how to treat hypoglycemia, in case you are not able to treat yourself.  Treating hypoglycemia  If you are alert and able to swallow safely, follow the 15:15 rule:   Take 15 grams of a rapid-acting carbohydrate. Talk with your health care provider about how much you should take.   Rapid-acting options include:  ? Glucose pills (take 15 grams).  ? 6-8 pieces of hard candy.  ?   mmol/L), take 15 grams of a carbohydrate again.  If your blood glucose level does not increase above 70 mg/dL (3.9 mmol/L) after 3 tries, seek emergency medical care.  After your blood glucose level returns to normal, eat a meal or a snack within 1 hour. Treating  severe hypoglycemia Severe hypoglycemia is when your blood glucose level is at or below 54 mg/dL (3 mmol/L). Severe hypoglycemia is an emergency. Do not wait to see if the symptoms will go away. Get medical help right away. Call your local emergency services (911 in the [place] If you have severe hypoglycemia and you cannot eat or drink, you may need an injection of glucagon. A family member or close friend should learn how to check your blood glucose and how to give you a glucagon injection. Ask your health care provider if you need to have an emergency glucagon injection kit available. Severe hypoglycemia may need to be treated in a hospital. The treatment may include getting glucose through an IV. You may also need treatment for the cause of your hypoglycemia. Follow these instructions at home: Take diabetes medicines as told  If your health care provider prescribed insulin or diabetes medicines, take them every day.  Do not run out of insulin or other diabetes medicines that you take. Plan ahead so you always have these available.  If you use insulin, adjust your dosage based on how physically active you are and what foods you eat. Your health care provider will tell you how to adjust your dosage. Make healthy food choices  The things that you eat and drink affect your blood glucose (and your insulin dose, if this applies). Making good choices helps to control your diabetes and prevent other health problems. A healthy meal plan includes eating lean proteins, complex carbohydrates, fresh fruits and vegetables, low-fat dairy products, and healthy fats. Make an appointment to see a diet and nutrition specialist (registered dietitian) to help you create an eating plan that is right for you. Make sure that you:  Follow instructions from your health care provider about eating or drinking restrictions.  Drink enough fluid to keep your urine pale yellow.  Eat healthy snacks between nutritious  meals.  Keep a record of the carbohydrates that you eat. Do this by reading food labels and learning the standard serving sizes of foods.  Follow your sick day plan whenever you cannot eat or drink as usual. Make this plan in advance with your health care provider.  Stay active  Do 30 or more minutes of physical activity a day, or as much physical activity as your health care provider recommends during your pregnancy. ? Doing 10 minutes of exercise starting 30 minutes after each meal may help to control postprandial blood glucose levels.  If you start a new exercise or activity, work with your health care provider to adjust your insulin, medicines, or food intake as needed. Make healthy lifestyle choices  Do not drink alcohol.  Do not use any tobacco products, such as cigarettes, chewing tobacco, and e-cigarettes. If you need help quitting, ask your health care provider.  Learn to manage stress. If you need help with this, ask your health care provider. Care for your body  Keep your immunizations up to date.  Brush your teeth and gums two times a day, and floss one or more times a day. Visit your dentist one or more times every 6 months.  Maintain a healthy weight during your pregnancy. General instructions  Take over-the-counter and  prescription medicines only as told by your health care provider.  Talk with your health care provider about your risk for high blood pressure during pregnancy (preeclampsia or eclampsia).  Share your diabetes management plan with people in your workplace, school, and household.  Check your urine for ketones during your pregnancy when you are ill and as told by your health care provider.  Carry a medical alert card or wear medical alert jewelry that says you have gestational diabetes.  Keep all follow-up visits during your pregnancy (prenatal) and after delivery (postnatal) as told by your health care provider. This is important. Get the care that  you need after delivery  Have your blood glucose level checked 4-12 weeks after delivery. This is done with an oral glucose tolerance test (OGTT).  Get screened for diabetes at least every 3 years, or as often as told by your health care provider. Questions to ask your health care provider  Do I need to meet with a diabetes educator?  Where can I find a support group for people with gestational diabetes? Where to find more information For more information about gestational diabetes, visit:  American Diabetes Association (ADA): www.diabetes.org  Centers for Disease Control and Prevention (CDC): http://www.wolf.info/ Summary  Check your blood glucose every day during your pregnancy. Do this as often as told by your health care provider.  If your health care provider prescribed insulin or diabetes medicines, take them every day as told.  Keep all follow-up visits during your pregnancy (prenatal) and after delivery (postnatal) as told by your health care provider. This is important.  Have your blood glucose level checked 4-12 weeks after delivery. This information is not intended to replace advice given to you by your health care provider. Make sure you discuss any questions you have with your health care provider. Document Released: 04/20/2015 Document Revised: 06/19/2017 Document Reviewed: 01/30/2015 Elsevier Interactive Patient Education  2019 [employer].

## 2018-01-29 NOTE — Progress Notes (Signed)
   PRENATAL VISIT NOTE  Subjective:  Stacy [name] is a 38 y.o. G3P2002 at [redacted]w[redacted]d being seen today for ongoing prenatal care.  She is currently monitored for the following issues for this high-risk pregnancy and has Supervision of other normal pregnancy, antepartum; AMA (advanced maternal age) multigravida 35+; and Gestational diabetes, diet controlled on their problem list.  Patient reports no complaints.  Contractions: Not present. Vag. Bleeding: None.  Movement: Present. Denies leaking of fluid.   The following portions of the patient's history were reviewed and updated as appropriate: allergies, current medications, past family history, past medical history, past social history, past surgical history and problem list. Problem list updated.  Objective:   Vitals:   01/29/18 1541  BP: 112/76  Pulse: 80  Weight: 70.8 kg    Fetal Status:     Movement: Present     General:  Alert, oriented and cooperative. Patient is in no acute distress.  Skin: Skin is warm and dry. No rash noted.   Cardiovascular: Normal heart rate noted  Respiratory: Normal respiratory effort, no problems with respiration noted  Abdomen: Soft, gravid, appropriate for gestational age.  Pain/Pressure: Absent     Pelvic: Cervical exam deferred        Extremities: Normal range of motion.     Mental Status: Normal mood and affect. Normal behavior. Normal judgment and thought content.   Assessment and Plan:  Pregnancy: G3P2002 at [redacted]w[redacted]d  1. Supervision of other normal pregnancy, antepartum --Anticipatory guidance about next visits/weeks of pregnancy given. - [place] MFM OB DETAIL +14 WK; Future  2. Need for immunization against influenza - Flu Vaccine QUAD 36+ mos IM  3. Gestational diabetes mellitus (GDM) in second trimester, gestational diabetes method of control unspecified --Pt went to diabetes class, then had to wait for Rx to pharmacy.  Less than one week of glucose values and pt reports she noticed a big  difference in glucose values based on her diet.  --Pt to continue to check glucose QID, call office if >25% of values are out of normal range.  If most values normal, keep appt in 2 weeks to review. --Continue to make healthy ADA approved meal choices, increase protein and decrease carbohydrates.  Preterm labor symptoms and general obstetric precautions including but not limited to vaginal bleeding, contractions, leaking of fluid and fetal movement were reviewed in detail with the patient. Please refer to After Visit Summary for other counseling recommendations.  No follow-ups on file.  No future appointments.  Sharen Counter, CNM

## 2018-01-30 ENCOUNTER — Telehealth: Payer: Self-pay

## 2018-01-30 NOTE — Telephone Encounter (Signed)
Returned call, pt had questions about glucose meter and strips

## 2018-02-05 ENCOUNTER — Other Ambulatory Visit: Payer: Self-pay

## 2018-02-05 DIAGNOSIS — O24419 Gestational diabetes mellitus in pregnancy, unspecified control: Secondary | ICD-10-CM

## 2018-02-05 MED ORDER — GLUCOSE BLOOD VI STRP: ORAL_STRIP | 6 refills | Status: DC

## 2018-02-05 NOTE — Progress Notes (Signed)
Sig needed to say "4 times daily" for insurance to cover onetouch verio strips.

## 2018-02-13 ENCOUNTER — Ambulatory Visit (INDEPENDENT_AMBULATORY_CARE_PROVIDER_SITE_OTHER): Payer: BC Managed Care – PPO | Admitting: Advanced Practice Midwife

## 2018-02-13 VITALS — BP 110/67 | HR 91 | Wt 158.2 lb

## 2018-02-13 DIAGNOSIS — Z3482 Encounter for supervision of other normal pregnancy, second trimester: Secondary | ICD-10-CM

## 2018-02-13 DIAGNOSIS — O24419 Gestational diabetes mellitus in pregnancy, unspecified control: Secondary | ICD-10-CM

## 2018-02-13 DIAGNOSIS — Z3480 Encounter for supervision of other normal pregnancy, unspecified trimester: Secondary | ICD-10-CM

## 2018-02-13 NOTE — Progress Notes (Signed)
Pt presents for ROB. °Pt has CBG readings today.  °

## 2018-02-13 NOTE — Progress Notes (Signed)
   PRENATAL VISIT NOTE  Subjective:  Stacy [name] is a 38 y.o. G3P2002 at [redacted]w[redacted]d being seen today for ongoing prenatal care.  She is currently monitored for the following issues for this low-risk pregnancy and has Supervision of other normal pregnancy, antepartum; AMA (advanced maternal age) multigravida 35+; and Gestational diabetes, diet controlled on their problem list.  Patient reports no complaints.  Contractions: Not present. Vag. Bleeding: None.  Movement: Present. Denies leaking of fluid.   The following portions of the patient's history were reviewed and updated as appropriate: allergies, current medications, past family history, past medical history, past social history, past surgical history and problem list. Problem list updated.  Objective:   Vitals:   02/13/18 0850  BP: 110/67  Pulse: 91  Weight: 71.8 kg    Fetal Status: Fetal Heart Rate (bpm): 148   Movement: Present     General:  Alert, oriented and cooperative. Patient is in no acute distress.  Skin: Skin is warm and dry. No rash noted.   Cardiovascular: Normal heart rate noted  Respiratory: Normal respiratory effort, no problems with respiration noted  Abdomen: Soft, gravid, appropriate for gestational age.  Pain/Pressure: Absent     Pelvic: Cervical exam deferred        Extremities: Normal range of motion.  Edema: None  Mental Status: Normal mood and affect. Normal behavior. Normal judgment and thought content.   Assessment and Plan:  Pregnancy: G3P2002 at [redacted]w[redacted]d  1. Gestational diabetes mellitus (GDM) in second trimester, gestational diabetes method of control unspecified --Fastings all out of normal range, 91-102.  PP all wnl except for 3 out of 20.  Discussed options with pt, recommendation to start Metformin QHS to reduce fasting glucose. --Pt feels like she can make some diet changes, adjust her snack at bedtime, increase protein.   --Recheck in 2 weeks, pt to call office if fastings >100 or if more  than 10% of PP are elevated.  2. Supervision of other normal pregnancy, antepartum --Anticipatory guidance about next visits/weeks of pregnancy given.  Preterm labor symptoms and general obstetric precautions including but not limited to vaginal bleeding, contractions, leaking of fluid and fetal movement were reviewed in detail with the patient. Please refer to After Visit Summary for other counseling recommendations.  No follow-ups on file.  Future Appointments  Date Time Provider Department Center  02/23/2018  2:30 PM [facility] [place] 1 [facility]    Sharen Counter, CNM

## 2018-02-13 NOTE — Patient Instructions (Signed)

## 2018-02-16 ENCOUNTER — Encounter (HOSPITAL_COMMUNITY): Payer: Self-pay

## 2018-02-23 ENCOUNTER — Other Ambulatory Visit (HOSPITAL_COMMUNITY): Payer: Self-pay | Admitting: *Deleted

## 2018-02-23 ENCOUNTER — Encounter (HOSPITAL_COMMUNITY): Payer: Self-pay

## 2018-02-23 ENCOUNTER — Ambulatory Visit (HOSPITAL_COMMUNITY)
Admission: RE | Admit: 2018-02-23 | Discharge: 2018-02-23 | Disposition: A | Discharge status: Home / Self Care | Payer: BC Managed Care – PPO | Source: Ambulatory Visit | Attending: Advanced Practice Midwife | Admitting: Advanced Practice Midwife

## 2018-02-23 DIAGNOSIS — O09522 Supervision of elderly multigravida, second trimester: Secondary | ICD-10-CM | POA: Diagnosis not present

## 2018-02-23 DIAGNOSIS — Z3A22 22 weeks gestation of pregnancy: Secondary | ICD-10-CM

## 2018-02-23 DIAGNOSIS — Z363 Encounter for antenatal screening for malformations: Secondary | ICD-10-CM | POA: Diagnosis not present

## 2018-02-23 DIAGNOSIS — O09529 Supervision of elderly multigravida, unspecified trimester: Secondary | ICD-10-CM | POA: Insufficient documentation

## 2018-02-23 DIAGNOSIS — O24410 Gestational diabetes mellitus in pregnancy, diet controlled: Secondary | ICD-10-CM | POA: Insufficient documentation

## 2018-02-23 DIAGNOSIS — Z3480 Encounter for supervision of other normal pregnancy, unspecified trimester: Secondary | ICD-10-CM | POA: Insufficient documentation

## 2018-02-27 ENCOUNTER — Telehealth: Payer: Self-pay | Admitting: Advanced Practice Midwife

## 2018-02-27 NOTE — Telephone Encounter (Signed)
[facility] called because she was concerned that her fasting levels may be too high.  She stated that Misty Stanley had wanted to start her on meds at her last visit, but patient declined.  I am also signing her up for the Diabetes Babyscripts so we will get alerts of any out of range sugars.    Her fastings for the last week are: 2/11 81 2/12 98 2/13 94 2/14 100 2/15 101 2/16 93 2/17 96 2/18 97  Levels routed to [facility] for review.

## 2018-02-28 ENCOUNTER — Encounter: Payer: Self-pay | Admitting: Advanced Practice Midwife

## 2018-02-28 ENCOUNTER — Other Ambulatory Visit: Payer: Self-pay | Admitting: Advanced Practice Midwife

## 2018-02-28 DIAGNOSIS — O24415 Gestational diabetes mellitus in pregnancy, controlled by oral hypoglycemic drugs: Secondary | ICD-10-CM

## 2018-02-28 MED ORDER — METFORMIN HCL 500 MG PO TABS: 500.0000 mg | ORAL_TABLET | Freq: Every day | ORAL | 5 refills | Status: DC

## 2018-02-28 NOTE — Progress Notes (Signed)
Pt called office to report that more than half of her fasting glucose values are elevated. Start on Metformin 500 mg QHS.  If PP also elevated (pt did not indicate this), she should take the Metformin BID.  Rx sent to pharmacy and MyChart message sent. Office to call pt tomorrow.

## 2018-03-13 ENCOUNTER — Ambulatory Visit (INDEPENDENT_AMBULATORY_CARE_PROVIDER_SITE_OTHER): Payer: BC Managed Care – PPO | Admitting: Advanced Practice Midwife

## 2018-03-13 VITALS — BP 115/75 | HR 84 | Wt 157.8 lb

## 2018-03-13 DIAGNOSIS — O24415 Gestational diabetes mellitus in pregnancy, controlled by oral hypoglycemic drugs: Secondary | ICD-10-CM

## 2018-03-13 DIAGNOSIS — O0990 Supervision of high risk pregnancy, unspecified, unspecified trimester: Secondary | ICD-10-CM

## 2018-03-13 DIAGNOSIS — Z3A24 24 weeks gestation of pregnancy: Secondary | ICD-10-CM

## 2018-03-13 MED ORDER — GLYBURIDE 5 MG PO TABS: 5.0000 mg | ORAL_TABLET | Freq: Every day | ORAL | 2 refills | Status: DC

## 2018-03-13 NOTE — Patient Instructions (Signed)

## 2018-03-13 NOTE — Progress Notes (Signed)
   PRENATAL VISIT NOTE  Subjective:  Stacy [name] is a 38 y.o. G3P2002 at [redacted]w[redacted]d being seen today for ongoing prenatal care.  She is currently monitored for the following issues for this high-risk pregnancy and has Supervision of high risk pregnancy, antepartum; AMA (advanced maternal age) multigravida 35+; and Gestational diabetes mellitus (GDM) controlled on oral hypoglycemic drug, antepartum on their problem list.  Patient reports no complaints.  Contractions: Not present. Vag. Bleeding: None.  Movement: Present. Denies leaking of fluid.   The following portions of the patient's history were reviewed and updated as appropriate: allergies, current medications, past family history, past medical history, past social history, past surgical history and problem list. Problem list updated.  Objective:   Vitals:   03/13/18 0838  BP: 115/75  Pulse: 84  Weight: 71.6 kg    Fetal Status: Fetal Heart Rate (bpm): 158   Movement: Present     General:  Alert, oriented and cooperative. Patient is in no acute distress.  Skin: Skin is warm and dry. No rash noted.   Cardiovascular: Normal heart rate noted  Respiratory: Normal respiratory effort, no problems with respiration noted  Abdomen: Soft, gravid, appropriate for gestational age.  Pain/Pressure: Present     Pelvic: Cervical exam deferred        Extremities: Normal range of motion.  Edema: None  Mental Status: Normal mood and affect. Normal behavior. Normal judgment and thought content.   Assessment and Plan:  Pregnancy: G3P2002 at [redacted]w[redacted]d  1. Gestational diabetes mellitus (GDM) in second trimester controlled on oral hypoglycemic drug --Reviewed glucose log.  Fasting CBG range 87 to 102 with 13 out of 15 abnormal values. PP CBG range 90 to 140 with 4 out of 45 abnormal and pt reports half of these were taken at 1 hour due to schedule.   --Fasting glucose not well controlled. Recommend increasing Metformin to 1000 Q HS but pt asked about  glyburide instead (smaller pill, her sister took it). --Consult Dr Debroah Loop and pt switched to Glyburide 5 mg Q HS and Metformin D/C'd. Pt to notify office in 1 week of fasting and PP glucose values in case medications need to be adjusted.   2. Supervision of high risk pregnancy, antepartum --Anticipatory guidance about next visits/weeks of pregnancy given.  Preterm labor symptoms and general obstetric precautions including but not limited to vaginal bleeding, contractions, leaking of fluid and fetal movement were reviewed in detail with the patient. Please refer to After Visit Summary for other counseling recommendations.  No follow-ups on file.  Future Appointments  Date Time Provider Department Center  03/29/2018  8:30 AM [facility] [place] 1 [facility]    Sharen Counter, CNM

## 2018-03-15 ENCOUNTER — Encounter: Payer: Self-pay | Admitting: Advanced Practice Midwife

## 2018-03-26 NOTE — Patient Instructions (Signed)
Gestational Diabetes Mellitus, Self Care Caring for yourself after you have been diagnosed with gestational diabetes (gestational diabetes mellitus) means keeping your blood sugar (glucose) under control. You can do that with a balance of:  Nutrition.  Exercise.  Lifestyle changes.  Medicines or insulin, if necessary.  Support from your team of health care providers and others. The following information explains what you need to know to manage your gestational diabetes at home. What are the risks? If gestational diabetes is treated, it is unlikely to cause problems. If it is not controlled with treatment, it may cause problems during labor and delivery, and some of those problems can be harmful to the unborn baby (fetus) and the mother. Uncontrolled gestational diabetes may also cause the newborn baby to have breathing problems and low blood glucose. Women who get gestational diabetes are more likely to develop it if they get pregnant again, and they are more likely to develop type 2 diabetes in the future. How to monitor blood glucose   Check your blood glucose every day during your pregnancy. Do this as often as told by your health care provider.  Contact your health care provider if your blood glucose is above your target for two tests in a row. Your health care provider will set individualized treatment goals for you. Generally, the goal of treatment is to maintain the following blood glucose levels during pregnancy:  Before meals (preprandial): at or below 95 mg/dL (5.3 mmol/L).  After meals (postprandial): ? One hour after a meal: at or below 140 mg/dL (7.8 mmol/L). ? Two hours after a meal: at or below 120 mg/dL (6.7 mmol/L).  A1c (hemoglobin A1c) level: 6-6.5%. How to manage hyperglycemia and hypoglycemia Hyperglycemia symptoms Hyperglycemia, also called high blood glucose, occurs when blood glucose is too high. Make sure you know the early signs of hyperglycemia, such as:   Increased thirst.  Hunger.  Feeling very tired.  Needing to urinate more often than usual.  Blurry vision. Hypoglycemia symptoms Hypoglycemia, also called low blood glucose, occurs with a blood glucose level at or below 70 mg/dL (3.9 mmol/L). The risk for hypoglycemia increases during or after exercise, during sleep, during illness, and when skipping meals or not eating for a long time (fasting). Symptoms may include:  Hunger.  Anxiety.  Sweating and feeling clammy.  Confusion.  Dizziness or feeling light-headed.  Sleepiness.  Nausea.  Increased heart rate.  Headache.  Blurry vision.  Irritability.  Tingling or numbness around the mouth, lips, or tongue.  A change in coordination.  Restless sleep.  Fainting.  Seizure. It is important to know the symptoms of hypoglycemia and treat it right away. Always have a 15-gram rapid-acting carbohydrate snack with you to treat low blood glucose. Family members and close friends should also know the symptoms and should understand how to treat hypoglycemia, in case you are not able to treat yourself. Treating hypoglycemia If you are alert and able to swallow safely, follow the 15:15 rule:  Take 15 grams of a rapid-acting carbohydrate. Talk with your health care provider about how much you should take.  Rapid-acting options include: ? Glucose pills (take 15 grams). ? 6-8 pieces of hard candy. ? 4-6 oz (120-150 mL) of fruit juice. ? 4-6 oz (120-150 mL) of regular (not diet) soda. ? 1 Tbsp (15 mL) honey or sugar.  Check your blood glucose 15 minutes after you take the carbohydrate.  If the repeat blood glucose level is still at or below 70 mg/dL (3.9  4-6 oz (120-150 mL) of fruit juice.  ? 4-6 oz (120-150 mL) of regular (not diet) soda.  ? 1 Tbsp (15 mL) honey or sugar.   Check your blood glucose 15 minutes after you take the carbohydrate.   If the repeat blood glucose level is still at or below 70 mg/dL (3.9 mmol/L), take 15 grams of a carbohydrate again.   If your blood glucose level does not increase above 70 mg/dL (3.9 mmol/L) after 3 tries, seek emergency medical care.   After your blood glucose level returns to normal, eat a meal or a snack within 1 hour.  Treating  severe hypoglycemia  Severe hypoglycemia is when your blood glucose level is at or below 54 mg/dL (3 mmol/L). Severe hypoglycemia is an emergency. Do not wait to see if the symptoms will go away. Get medical help right away. Call your local emergency services (911 in the [place]  If you have severe hypoglycemia and you cannot eat or drink, you may need an injection of glucagon. A family member or close friend should learn how to check your blood glucose and how to give you a glucagon injection. Ask your health care provider if you need to have an emergency glucagon injection kit available.  Severe hypoglycemia may need to be treated in a hospital. The treatment may include getting glucose through an IV. You may also need treatment for the cause of your hypoglycemia.  Follow these instructions at home:  Take diabetes medicines as told   If your health care provider prescribed insulin or diabetes medicines, take them every day.   Do not run out of insulin or other diabetes medicines that you take. Plan ahead so you always have these available.   If you use insulin, adjust your dosage based on how physically active you are and what foods you eat. Your health care provider will tell you how to adjust your dosage.  Make healthy food choices    The things that you eat and drink affect your blood glucose (and your insulin dose, if this applies). Making good choices helps to control your diabetes and prevent other health problems. A healthy meal plan includes eating lean proteins, complex carbohydrates, fresh fruits and vegetables, low-fat dairy products, and healthy fats.  Make an appointment to see a diet and nutrition specialist (registered dietitian) to help you create an eating plan that is right for you. Make sure that you:   Follow instructions from your health care provider about eating or drinking restrictions.   Drink enough fluid to keep your urine pale yellow.   Eat healthy snacks between nutritious  meals.   Keep a record of the carbohydrates that you eat. Do this by reading food labels and learning the standard serving sizes of foods.   Follow your sick day plan whenever you cannot eat or drink as usual. Make this plan in advance with your health care provider.    Stay active   Do 30 or more minutes of physical activity a day, or as much physical activity as your health care provider recommends during your pregnancy.  ? Doing 10 minutes of exercise starting 30 minutes after each meal may help to control postprandial blood glucose levels.   If you start a new exercise or activity, work with your health care provider to adjust your insulin, medicines, or food intake as needed.  Make healthy lifestyle choices   Do not drink alcohol.   Do not use any tobacco products, such as   prescription medicines only as told by your health care provider.  Talk with your health care provider about your risk for high blood pressure during pregnancy (preeclampsia or eclampsia).  Share your diabetes management plan with people in your workplace, school, and household.  Check your urine for ketones during your pregnancy when you are ill and as told by your health care provider.  Carry a medical alert card or wear medical alert jewelry that says you have gestational diabetes.  Keep all follow-up visits during your pregnancy (prenatal) and after delivery (postnatal) as told by your health care provider. This is important. Get the care that you need after  delivery  Have your blood glucose level checked 4-12 weeks after delivery. This is done with an oral glucose tolerance test (OGTT).  Get screened for diabetes at least every 3 years, or as often as told by your health care provider. Questions to ask your health care provider  Do I need to meet with a diabetes educator?  Where can I find a support group for people with gestational diabetes? Where to find more information For more information about gestational diabetes, visit:  American Diabetes Association (ADA): www.diabetes.org  Centers for Disease Control and Prevention (CDC): http://www.wolf.info/ Summary  Check your blood glucose every day during your pregnancy. Do this as often as told by your health care provider.  If your health care provider prescribed insulin or diabetes medicines, take them every day as told.  Keep all follow-up visits during your pregnancy (prenatal) and after delivery (postnatal) as told by your health care provider. This is important.  Have your blood glucose level checked 4-12 weeks after delivery. This information is not intended to replace advice given to you by your health care provider. Make sure you discuss any questions you have with your health care provider. Document Released: 04/20/2015 Document Revised: 06/19/2017 Document Reviewed: 01/30/2015 Elsevier Interactive Patient Education  2019 [employer].

## 2018-03-26 NOTE — Progress Notes (Addendum)
   PRENATAL VISIT NOTE  Subjective:  Stacy [name] is a 38 y.o. G3P2002 at [redacted]w[redacted]d being seen today for ongoing prenatal care.  She is currently monitored for the following issues for this high-risk pregnancy and has Supervision of high risk pregnancy, antepartum; AMA (advanced maternal age) multigravida 35+; and Gestational diabetes mellitus (GDM) controlled on oral hypoglycemic drug, antepartum on their problem list.  Patient reports feeling shaky, sweating in the middle of the night with Glyburide.  Contractions: Not present. Vag. Bleeding: None.  Movement: Present. Denies leaking of fluid.   The following portions of the patient's history were reviewed and updated as appropriate: allergies, current medications, past family history, past medical history, past social history, past surgical history and problem list.   Objective:   Vitals:   03/27/18 1050  BP: 109/75  Pulse: 76  Weight: 71.7 kg    Fetal Status: Fetal Heart Rate (bpm): 150   Movement: Present     General:  Alert, oriented and cooperative. Patient is in no acute distress.  Skin: Skin is warm and dry. No rash noted.   Cardiovascular: Normal heart rate noted  Respiratory: Normal respiratory effort, no problems with respiration noted  Abdomen: Soft, gravid, appropriate for gestational age.  Pain/Pressure: Absent     Pelvic: Cervical exam deferred        Extremities: Normal range of motion.     Mental Status: Normal mood and affect. Normal behavior. Normal judgment and thought content.   Assessment and Plan:  Pregnancy: G3P2002 at [redacted]w[redacted]d  1. Gestational diabetes mellitus (GDM) in second trimester controlled on oral hypoglycemic drug --Reviewed blood glucose log.  Fasting glucose all 77-92 in last week, PP 2 out of 20 out of range in 130s.   --Pt reports she is only taking 1/2 of the Glyburide 5 mg tablet because she wakes up at 2 am shaky, sweaty, and her glucose is in the 60s.   --Consult Dr Alysia Penna with assessment  and findings --Pt to continue Glyburide but take with evening meal instead of bedtime.  Continue to eat plenty of protein, especially at bedtime.  Rx still for 5 mg dose but pt may continue to half and take 2.5 but is to message the office/me in 1 week with glucose log.   --Pt states understanding and will change medication time to evening meal and communicate with provider in 1 week, return visit in 2.  2. Supervision of high risk pregnancy, antepartum --Anticipatory guidance about next visits/weeks of pregnancy given. --28 week labs today except 2 hour GTT, as pt is GDM by early test. --Reviewed safety, visitor policy, reassurance about COVID-19 for pregnancy at this time.  Preterm labor symptoms and general obstetric precautions including but not limited to vaginal bleeding, contractions, leaking of fluid and fetal movement were reviewed in detail with the patient. Please refer to After Visit Summary for other counseling recommendations.   No follow-ups on file.  Future Appointments  Date Time Provider Department Center  03/29/2018  8:15 AM WH-MFC NURSE WH-MFC MFC-US  03/29/2018  8:30 AM WH-MFC [place] 1 WH-MFCUS MFC-US    Sharen Counter, CNM

## 2018-03-27 ENCOUNTER — Ambulatory Visit (INDEPENDENT_AMBULATORY_CARE_PROVIDER_SITE_OTHER): Payer: BC Managed Care – PPO | Admitting: Advanced Practice Midwife

## 2018-03-27 ENCOUNTER — Other Ambulatory Visit: Payer: Self-pay

## 2018-03-27 VITALS — BP 109/75 | HR 76 | Wt 158.0 lb

## 2018-03-27 DIAGNOSIS — O24415 Gestational diabetes mellitus in pregnancy, controlled by oral hypoglycemic drugs: Secondary | ICD-10-CM

## 2018-03-27 DIAGNOSIS — Z3A26 26 weeks gestation of pregnancy: Secondary | ICD-10-CM

## 2018-03-27 DIAGNOSIS — O0990 Supervision of high risk pregnancy, unspecified, unspecified trimester: Secondary | ICD-10-CM

## 2018-03-27 NOTE — Addendum Note (Signed)
Addended by: Areta Haber B on: 03/27/2018 12:03 PM   Modules accepted: Orders

## 2018-03-27 NOTE — Addendum Note (Signed)
Addended by: Areta Haber B on: 03/27/2018 12:02 PM   Modules accepted: Orders

## 2018-03-28 LAB — CBC
Hematocrit: 35.7 % (ref 34.0–46.6)
Hemoglobin: 11.6 g/dL (ref 11.1–15.9)
MCH: 27.8 pg (ref 26.6–33.0)
MCHC: 32.5 g/dL (ref 31.5–35.7)
MCV: 86 fL (ref 79–97)
Platelets: 312 10*3/uL (ref 150–450)
RBC: 4.17 x10E6/uL (ref 3.77–5.28)
RDW: 13.4 % (ref 11.7–15.4)
WBC: 7.8 10*3/uL (ref 3.4–10.8)

## 2018-03-28 LAB — HIV ANTIBODY (ROUTINE TESTING W REFLEX): HIV Screen 4th Generation wRfx: NONREACTIVE

## 2018-03-28 LAB — RPR: RPR Ser Ql: NONREACTIVE

## 2018-03-29 ENCOUNTER — Ambulatory Visit (HOSPITAL_COMMUNITY): Payer: BC Managed Care – PPO

## 2018-03-29 ENCOUNTER — Encounter (HOSPITAL_COMMUNITY): Payer: Self-pay

## 2018-03-29 ENCOUNTER — Ambulatory Visit (HOSPITAL_COMMUNITY)
Admission: RE | Admit: 2018-03-29 | Discharge: 2018-03-29 | Disposition: A | Discharge status: Home / Self Care | Payer: BC Managed Care – PPO | Source: Ambulatory Visit | Attending: Advanced Practice Midwife | Admitting: Advanced Practice Midwife

## 2018-03-29 ENCOUNTER — Other Ambulatory Visit: Payer: Self-pay

## 2018-03-29 ENCOUNTER — Other Ambulatory Visit (HOSPITAL_COMMUNITY): Payer: Self-pay | Admitting: *Deleted

## 2018-03-29 ENCOUNTER — Ambulatory Visit (HOSPITAL_COMMUNITY): Payer: BC Managed Care – PPO | Admitting: *Deleted

## 2018-03-29 VITALS — BP 116/60 | HR 78 | Wt 159.2 lb

## 2018-03-29 DIAGNOSIS — Z362 Encounter for other antenatal screening follow-up: Secondary | ICD-10-CM | POA: Diagnosis not present

## 2018-03-29 DIAGNOSIS — O09523 Supervision of elderly multigravida, third trimester: Secondary | ICD-10-CM

## 2018-03-29 DIAGNOSIS — O09522 Supervision of elderly multigravida, second trimester: Secondary | ICD-10-CM | POA: Insufficient documentation

## 2018-03-29 DIAGNOSIS — Z3A27 27 weeks gestation of pregnancy: Secondary | ICD-10-CM | POA: Diagnosis not present

## 2018-03-29 DIAGNOSIS — O24410 Gestational diabetes mellitus in pregnancy, diet controlled: Secondary | ICD-10-CM

## 2018-04-12 ENCOUNTER — Other Ambulatory Visit: Payer: Self-pay

## 2018-04-12 ENCOUNTER — Encounter: Payer: Self-pay | Admitting: Certified Nurse Midwife

## 2018-04-12 ENCOUNTER — Ambulatory Visit (INDEPENDENT_AMBULATORY_CARE_PROVIDER_SITE_OTHER): Payer: BC Managed Care – PPO | Admitting: Certified Nurse Midwife

## 2018-04-12 DIAGNOSIS — O0990 Supervision of high risk pregnancy, unspecified, unspecified trimester: Secondary | ICD-10-CM

## 2018-04-12 DIAGNOSIS — O09523 Supervision of elderly multigravida, third trimester: Secondary | ICD-10-CM

## 2018-04-12 DIAGNOSIS — O24415 Gestational diabetes mellitus in pregnancy, controlled by oral hypoglycemic drugs: Secondary | ICD-10-CM

## 2018-04-12 DIAGNOSIS — Z3A29 29 weeks gestation of pregnancy: Secondary | ICD-10-CM

## 2018-04-12 MED ORDER — GLYBURIDE 2.5 MG PO TABS: 2.5000 mg | ORAL_TABLET | Freq: Every day | ORAL | 3 refills | Status: DC

## 2018-04-12 MED ORDER — BLOOD PRESSURE MONITOR KIT: 1.0000 | PACK | Freq: Every day | 0 refills | Status: DC

## 2018-04-12 NOTE — Progress Notes (Signed)
   TELEHEALTH VIRTUAL OBSTETRICS VISIT ENCOUNTER NOTE  I connected with [name] on 04/12/18 at 8:54 AM EDT by telephone at home and verified that I am speaking with the correct person using two identifiers.   I discussed the limitations, risks, security and privacy concerns of performing an evaluation and management service by telephone and the availability of in person appointments. I also discussed with the patient that there may be a patient responsible charge related to this service. The patient expressed understanding and agreed to proceed.  Subjective:  Stacy [name] is a 38 y.o. G3P2002 at [redacted]w[redacted]d being followed for ongoing prenatal care.  She is currently monitored for the following issues for this high-risk pregnancy and has Supervision of high risk pregnancy, antepartum; AMA (advanced maternal age) multigravida 35+; and Gestational diabetes mellitus (GDM) controlled on oral hypoglycemic drug, antepartum on their problem list.  Patient reports no complaints. Reports fetal movement. Denies any contractions, bleeding or leaking of fluid.   The following portions of the patient's history were reviewed and updated as appropriate: allergies, current medications, past family history, past medical history, past social history, past surgical history and problem list.   Objective:   General:  Alert, oriented and cooperative.   Mental Status: Normal mood and affect perceived. Normal judgment and thought content.  Rest of physical exam deferred due to type of encounter  Assessment and Plan:  Pregnancy: G3P2002 at [redacted]w[redacted]d 1. Supervision of high risk pregnancy, antepartum - Patient doing well, no complaints  - Anticipatory guidance on upcoming appointments with next visit being telehealth - Patient does not have BP cuff at home but plans to pick up BP cuff from pharmacy today with prescribed medication  2. Multigravida of advanced maternal age in third trimester  3. Gestational  diabetes mellitus (GDM) controlled on oral hypoglycemic drug, antepartum - Currently taking 2.5mg  tablet and not full 5mg  tablet with good control, prescription switched to 2.5mg  dosing  - Last 2 weeks  Fasting: 76 81 58 86 89 76 82  2hr PP Breakfast: 120 113 115  118 99 121  2hr PP Lunch: 94 96 116 105 120 118 125 93 64 2hr PP dinner: 122 120  115 105 107 119 92 114 116 - Follow up as scheduled with [place] and BPP starting at 32 weeks   Preterm labor symptoms and general obstetric precautions including but not limited to vaginal bleeding, contractions, leaking of fluid and fetal movement were reviewed in detail with the patient.  I discussed the assessment and treatment plan with the patient. The patient was provided an opportunity to ask questions and all were answered. The patient agreed with the plan and demonstrated an understanding of the instructions. The patient was advised to call back or seek an in-person office evaluation/go to [facility] at [facility] for any urgent or concerning symptoms. Please refer to After Visit Summary for other counseling recommendations.   I provided 12 minutes of non-face-to-face time during this encounter.  Return in about 2 weeks (around 04/26/2018), or TELEVISIT- ROB .  Future Appointments  Date Time Provider Department Center  05/03/2018  7:45 AM WH-MFC NURSE WH-MFC MFC-US  05/03/2018  7:45 AM WH-MFC [place] 2 WH-MFCUS MFC-US    Sharyon Cable, CNM Center for [employer], [facility]

## 2018-04-12 NOTE — Progress Notes (Signed)
Telehealth Visit   Patient has no complaints today.  Needs to discuss Glyburide

## 2018-04-26 ENCOUNTER — Other Ambulatory Visit: Payer: Self-pay

## 2018-04-26 ENCOUNTER — Ambulatory Visit (INDEPENDENT_AMBULATORY_CARE_PROVIDER_SITE_OTHER): Payer: BC Managed Care – PPO | Admitting: Family Medicine

## 2018-04-26 DIAGNOSIS — Z3A31 31 weeks gestation of pregnancy: Secondary | ICD-10-CM

## 2018-04-26 DIAGNOSIS — O24415 Gestational diabetes mellitus in pregnancy, controlled by oral hypoglycemic drugs: Secondary | ICD-10-CM

## 2018-04-26 MED ORDER — GLYBURIDE 2.5 MG PO TABS: 2.5000 mg | ORAL_TABLET | Freq: Two times a day (BID) | ORAL | 3 refills | Status: DC

## 2018-04-26 NOTE — Progress Notes (Signed)
    TELEHEALTH VIRTUAL OBSTETRICS VISIT ENCOUNTER NOTE  I connected with Stacy [name] on 04/26/18 at 10:25 AM EDT by telephone at home and verified that I am speaking with the correct person using two identifiers.   I discussed the limitations, risks, security and privacy concerns of performing an evaluation and management service by telephone and the availability of in person appointments. I also discussed with the patient that there may be a patient responsible charge related to this service. The patient expressed understanding and agreed to proceed.  Subjective:  Stacy [name] is a 38 y.o. G3P2002 at [redacted]w[redacted]d being followed for ongoing prenatal care.  She is currently monitored for the following issues for this high-risk pregnancy and has Supervision of high risk pregnancy, antepartum; AMA (advanced maternal age) multigravida 35+; and Gestational diabetes mellitus (GDM) controlled on oral hypoglycemic drug, antepartum on their problem list.  Patient reports no complaints. Reports fetal movement. Denies any contractions, bleeding or leaking of fluid.   The following portions of the patient's history were reviewed and updated as appropriate: allergies, current medications, past family history, past medical history, past social history, past surgical history and problem list.   Objective:   General:  Alert, oriented and cooperative.   Mental Status: Normal mood and affect perceived. Normal judgment and thought content.  Rest of physical exam deferred due to type of encounter  Assessment and Plan:  Pregnancy: G3P2002 at [redacted]w[redacted]d 1. Gestational diabetes mellitus (GDM) controlled on oral hypoglycemic drug, antepartum FBS 69-95, one 105--may have skipped meds 2 hour pp breakfast 83-120 except 3--129, 131, 130 in 2 wks are high After lunch 90-120 After dinner 120-140 Reports blood pressure are good - glyBURIDE (DIABETA) 2.5 MG tablet; Take 1 tablet (2.5 mg total) by mouth 2 (two) times  daily with a meal.  Dispense: 60 tablet; Refill: 3  Preterm labor symptoms and general obstetric precautions including but not limited to vaginal bleeding, contractions, leaking of fluid and fetal movement were reviewed in detail with the patient.  I discussed the assessment and treatment plan with the patient. The patient was provided an opportunity to ask questions and all were answered. The patient agreed with the plan and demonstrated an understanding of the instructions. The patient was advised to call back or seek an in-person office evaluation/go to MAU at [facility] for any urgent or concerning symptoms. Please refer to After Visit Summary for other counseling recommendations.   I provided 10 minutes of non-face-to-face time during this encounter.  No follow-ups on file.  Future Appointments  Date Time Provider Department Center  05/03/2018  7:45 AM WH-MFC NURSE WH-MFC MFC-US  05/03/2018  7:45 AM WH-MFC [place] 2 WH-MFCUS MFC-US    Reva Bores, MD [facility] for [facility], [facility]

## 2018-05-03 ENCOUNTER — Ambulatory Visit (HOSPITAL_COMMUNITY): Payer: BC Managed Care – PPO | Admitting: *Deleted

## 2018-05-03 ENCOUNTER — Ambulatory Visit (HOSPITAL_COMMUNITY)
Admission: RE | Admit: 2018-05-03 | Discharge: 2018-05-03 | Disposition: A | Discharge status: Home / Self Care | Payer: BC Managed Care – PPO | Source: Ambulatory Visit | Attending: Obstetrics and Gynecology | Admitting: Obstetrics and Gynecology

## 2018-05-03 ENCOUNTER — Other Ambulatory Visit (HOSPITAL_COMMUNITY): Payer: Self-pay | Admitting: *Deleted

## 2018-05-03 ENCOUNTER — Encounter (HOSPITAL_COMMUNITY): Payer: Self-pay

## 2018-05-03 ENCOUNTER — Other Ambulatory Visit: Payer: Self-pay

## 2018-05-03 DIAGNOSIS — O24410 Gestational diabetes mellitus in pregnancy, diet controlled: Secondary | ICD-10-CM | POA: Insufficient documentation

## 2018-05-03 DIAGNOSIS — Z362 Encounter for other antenatal screening follow-up: Secondary | ICD-10-CM

## 2018-05-03 DIAGNOSIS — O24415 Gestational diabetes mellitus in pregnancy, controlled by oral hypoglycemic drugs: Secondary | ICD-10-CM | POA: Diagnosis present

## 2018-05-03 DIAGNOSIS — O0990 Supervision of high risk pregnancy, unspecified, unspecified trimester: Secondary | ICD-10-CM | POA: Insufficient documentation

## 2018-05-03 DIAGNOSIS — O09523 Supervision of elderly multigravida, third trimester: Secondary | ICD-10-CM

## 2018-05-03 DIAGNOSIS — Z3A32 32 weeks gestation of pregnancy: Secondary | ICD-10-CM | POA: Diagnosis not present

## 2018-05-10 ENCOUNTER — Other Ambulatory Visit: Payer: Self-pay

## 2018-05-10 ENCOUNTER — Encounter: Payer: Self-pay | Admitting: Obstetrics and Gynecology

## 2018-05-10 ENCOUNTER — Encounter: Payer: BC Managed Care – PPO | Admitting: Obstetrics and Gynecology

## 2018-05-10 ENCOUNTER — Ambulatory Visit (INDEPENDENT_AMBULATORY_CARE_PROVIDER_SITE_OTHER): Payer: BC Managed Care – PPO | Admitting: Obstetrics and Gynecology

## 2018-05-10 VITALS — BP 104/74 | HR 90

## 2018-05-10 DIAGNOSIS — O0993 Supervision of high risk pregnancy, unspecified, third trimester: Secondary | ICD-10-CM

## 2018-05-10 DIAGNOSIS — O09523 Supervision of elderly multigravida, third trimester: Secondary | ICD-10-CM

## 2018-05-10 DIAGNOSIS — Z3A33 33 weeks gestation of pregnancy: Secondary | ICD-10-CM

## 2018-05-10 DIAGNOSIS — O0990 Supervision of high risk pregnancy, unspecified, unspecified trimester: Secondary | ICD-10-CM

## 2018-05-10 DIAGNOSIS — O24415 Gestational diabetes mellitus in pregnancy, controlled by oral hypoglycemic drugs: Secondary | ICD-10-CM

## 2018-05-10 NOTE — Progress Notes (Signed)
Webex ROB:  Pt presents for ROB c/o hemorrhoid x 2 days ago. Denies pain and constipation. Did not try any OTC meds yet. Pt is checking CBG's daily fasting highest (90) after meals (120).

## 2018-05-10 NOTE — Progress Notes (Signed)
   TELEHEALTH VIRTUAL OBSTETRICS PRENATAL VISIT ENCOUNTER NOTE  I connected with Stacy [name] on 05/10/18 at  1:45 PM EDT by WebEx at home and verified that I am speaking with the correct person using two identifiers.   I discussed the limitations, risks, security and privacy concerns of performing an evaluation and management service by telephone and the availability of in person appointments. I also discussed with the patient that there may be a patient responsible charge related to this service. The patient expressed understanding and agreed to proceed. Subjective:  Stacy [name] is a 38 y.o. G3P2002 at [redacted]w[redacted]d being seen today for ongoing prenatal care.  She is currently monitored for the following issues for this high-risk pregnancy and has Supervision of high risk pregnancy, antepartum; AMA (advanced maternal age) multigravida 35+; and Gestational diabetes mellitus (GDM) controlled on oral hypoglycemic drug, antepartum on their problem list.  Patient reports she has some hemorrhoid pain. Reports fetal movement. Contractions: Not present. Vag. Bleeding: None.  Movement: Present. Denies any contractions, bleeding or leaking of fluid.   The following portions of the patient's history were reviewed and updated as appropriate: allergies, current medications, past family history, past medical history, past social history, past surgical history and problem list.   Objective:   Vitals:   05/10/18 1342  BP: 104/74  Pulse: 90    Fetal Status:     Movement: Present     General:  Alert, oriented and cooperative. Patient is in no acute distress.  Respiratory: Normal respiratory effort, no problems with respiration noted  Mental Status: Normal mood and affect. Normal behavior. Normal judgment and thought content.  Rest of physical exam deferred due to type of encounter  Assessment and Plan:  Pregnancy: G3P2002 at [redacted]w[redacted]d  1. Supervision of high risk pregnancy, antepartum  2.  Gestational diabetes mellitus (GDM) controlled on oral hypoglycemic drug, antepartum Next BPP tomorrow Glyburide 2.5 mg QHS, 1.25 mg with breakfast FG: highest has been 90 PP: highest has been 120 Cont current regimen  3. Multigravida of advanced maternal age in third trimester   Preterm labor symptoms and general obstetric precautions including but not limited to vaginal bleeding, contractions, leaking of fluid and fetal movement were reviewed in detail with the patient. I discussed the assessment and treatment plan with the patient. The patient was provided an opportunity to ask questions and all were answered. The patient agreed with the plan and demonstrated an understanding of the instructions. The patient was advised to call back or seek an in-person office evaluation/go to MAU at [facility] for any urgent or concerning symptoms. Please refer to After Visit Summary for other counseling recommendations.   I provided 16 minutes of face-to-face via WebEx time during this encounter.  Return in about 2 weeks (around 05/24/2018) for OB visit (MD).  Future Appointments  Date Time Provider Department Center  05/11/2018  7:30 AM WH-MFC NURSE WH-MFC MFC-US  05/11/2018  7:30 AM WH-MFC [place] 4 WH-MFCUS MFC-US  05/18/2018  8:00 AM WH-MFC NURSE WH-MFC MFC-US  05/18/2018  8:00 AM WH-MFC [place] 3 WH-MFCUS MFC-US  05/25/2018  7:40 AM WH-MFC NURSE WH-MFC MFC-US  05/25/2018  7:45 AM WH-MFC [place] 2 WH-MFCUS MFC-US  06/01/2018  8:00 AM WH-MFC NURSE WH-MFC MFC-US  06/01/2018  8:00 AM WH-MFC [place] 3 WH-MFCUS MFC-US    Conan Bowens, MD Center for [facility], [facility]

## 2018-05-11 ENCOUNTER — Ambulatory Visit (HOSPITAL_COMMUNITY): Payer: BC Managed Care – PPO | Admitting: *Deleted

## 2018-05-11 ENCOUNTER — Encounter (HOSPITAL_COMMUNITY): Payer: Self-pay

## 2018-05-11 ENCOUNTER — Ambulatory Visit (HOSPITAL_COMMUNITY)
Admission: RE | Admit: 2018-05-11 | Discharge: 2018-05-11 | Disposition: A | Discharge status: Home / Self Care | Payer: BC Managed Care – PPO | Source: Ambulatory Visit | Attending: Obstetrics and Gynecology | Admitting: Obstetrics and Gynecology

## 2018-05-11 VITALS — BP 102/58 | HR 69 | Temp 98.0°F

## 2018-05-11 DIAGNOSIS — O24410 Gestational diabetes mellitus in pregnancy, diet controlled: Secondary | ICD-10-CM | POA: Insufficient documentation

## 2018-05-11 DIAGNOSIS — O09523 Supervision of elderly multigravida, third trimester: Secondary | ICD-10-CM | POA: Diagnosis not present

## 2018-05-11 DIAGNOSIS — Z3A33 33 weeks gestation of pregnancy: Secondary | ICD-10-CM | POA: Diagnosis not present

## 2018-05-11 DIAGNOSIS — O24415 Gestational diabetes mellitus in pregnancy, controlled by oral hypoglycemic drugs: Secondary | ICD-10-CM

## 2018-05-18 ENCOUNTER — Ambulatory Visit (HOSPITAL_COMMUNITY)
Admission: RE | Admit: 2018-05-18 | Discharge: 2018-05-18 | Disposition: A | Discharge status: Home / Self Care | Payer: BC Managed Care – PPO | Source: Ambulatory Visit | Attending: Obstetrics and Gynecology | Admitting: Obstetrics and Gynecology

## 2018-05-18 ENCOUNTER — Other Ambulatory Visit: Payer: Self-pay

## 2018-05-18 ENCOUNTER — Ambulatory Visit (HOSPITAL_COMMUNITY): Payer: BC Managed Care – PPO | Admitting: *Deleted

## 2018-05-18 ENCOUNTER — Encounter (HOSPITAL_COMMUNITY): Payer: Self-pay

## 2018-05-18 VITALS — BP 98/60 | HR 83 | Temp 98.5°F

## 2018-05-18 DIAGNOSIS — O24410 Gestational diabetes mellitus in pregnancy, diet controlled: Secondary | ICD-10-CM | POA: Insufficient documentation

## 2018-05-18 DIAGNOSIS — O24415 Gestational diabetes mellitus in pregnancy, controlled by oral hypoglycemic drugs: Secondary | ICD-10-CM | POA: Diagnosis present

## 2018-05-18 DIAGNOSIS — O09523 Supervision of elderly multigravida, third trimester: Secondary | ICD-10-CM

## 2018-05-18 DIAGNOSIS — Z3A34 34 weeks gestation of pregnancy: Secondary | ICD-10-CM | POA: Diagnosis not present

## 2018-05-24 ENCOUNTER — Ambulatory Visit (INDEPENDENT_AMBULATORY_CARE_PROVIDER_SITE_OTHER): Payer: BC Managed Care – PPO | Admitting: Obstetrics and Gynecology

## 2018-05-24 ENCOUNTER — Other Ambulatory Visit: Payer: Self-pay

## 2018-05-24 ENCOUNTER — Encounter: Payer: Self-pay | Admitting: Obstetrics and Gynecology

## 2018-05-24 DIAGNOSIS — O24415 Gestational diabetes mellitus in pregnancy, controlled by oral hypoglycemic drugs: Secondary | ICD-10-CM

## 2018-05-24 DIAGNOSIS — O09523 Supervision of elderly multigravida, third trimester: Secondary | ICD-10-CM

## 2018-05-24 DIAGNOSIS — Z3A35 35 weeks gestation of pregnancy: Secondary | ICD-10-CM

## 2018-05-24 DIAGNOSIS — O0990 Supervision of high risk pregnancy, unspecified, unspecified trimester: Secondary | ICD-10-CM

## 2018-05-24 NOTE — Progress Notes (Signed)
Pt wants to discuss eating habits/checking sugars.

## 2018-05-24 NOTE — Progress Notes (Signed)
   TELEHEALTH VIRTUAL OBSTETRICS PRENATAL VISIT ENCOUNTER NOTE  I connected with Stacy [name] on 05/24/18 at  8:30 AM EDT by WebEx at home and verified that I am speaking with the correct person using two identifiers.   I discussed the limitations, risks, security and privacy concerns of performing an evaluation and management service by telephone and the availability of in person appointments. I also discussed with the patient that there may be a patient responsible charge related to this service. The patient expressed understanding and agreed to proceed. Subjective:  Stacy [name] is a 38 y.o. G3P2002 at [redacted]w[redacted]d being seen today for ongoing prenatal care.  She is currently monitored for the following issues for this high-risk pregnancy and has Supervision of high risk pregnancy, antepartum; AMA (advanced maternal age) multigravida 35+; and Gestational diabetes mellitus (GDM) controlled on oral hypoglycemic drug, antepartum on their problem list.  Patient reports general discomforts of pregnancy.  Reports fetal movement. Contractions: Not present. Vag. Bleeding: None.  Movement: Present. Denies any contractions, bleeding or leaking of fluid.   The following portions of the patient's history were reviewed and updated as appropriate: allergies, current medications, past family history, past medical history, past social history, past surgical history and problem list.   Objective:  There were no vitals filed for this visit.  Fetal Status:     Movement: Present     General:  Alert, oriented and cooperative. Patient is in no acute distress.  Respiratory: Normal respiratory effort, no problems with respiration noted  Mental Status: Normal mood and affect. Normal behavior. Normal judgment and thought content.  Rest of physical exam deferred due to type of encounter  Assessment and Plan:  Pregnancy: G3P2002 at [redacted]w[redacted]d 1. Supervision of high risk pregnancy, antepartum Stable Has BP cuff   2. Gestational diabetes mellitus (GDM) controlled on oral hypoglycemic drug, antepartum CBG's in goal range Continue with current Tx Unable to have BPP this Friday d/t child care issues Has BPP scheduled for 06/01/18  3. Multigravida of advanced maternal age in third trimester Nl NIPS  Preterm labor symptoms and general obstetric precautions including but not limited to vaginal bleeding, contractions, leaking of fluid and fetal movement were reviewed in detail with the patient. I discussed the assessment and treatment plan with the patient. The patient was provided an opportunity to ask questions and all were answered. The patient agreed with the plan and demonstrated an understanding of the instructions. The patient was advised to call back or seek an in-person office evaluation/go to [facility] at [facility] for any urgent or concerning symptoms. Please refer to After Visit Summary for other counseling recommendations.   I provided 11 minutes of face-to-face via WebEx time during this encounter.  Return in about 1 week (around 05/31/2018) for OB visit,face to face for GBS.  Future Appointments  Date Time Provider Department Center  06/01/2018  8:00 AM [facility] NURSE [facility]  06/01/2018  8:00 AM [facility] [place] 3 [facility]    Stacy Staggers, MD [facility], [facility]

## 2018-05-25 ENCOUNTER — Ambulatory Visit (HOSPITAL_COMMUNITY): Payer: BC Managed Care – PPO

## 2018-05-25 ENCOUNTER — Ambulatory Visit (HOSPITAL_COMMUNITY): Admission: RE | Admit: 2018-05-25 | Payer: BC Managed Care – PPO | Source: Ambulatory Visit

## 2018-05-31 ENCOUNTER — Other Ambulatory Visit: Payer: Self-pay

## 2018-05-31 ENCOUNTER — Ambulatory Visit (INDEPENDENT_AMBULATORY_CARE_PROVIDER_SITE_OTHER): Payer: BC Managed Care – PPO | Admitting: Obstetrics and Gynecology

## 2018-05-31 ENCOUNTER — Encounter: Payer: Self-pay | Admitting: Obstetrics and Gynecology

## 2018-05-31 VITALS — BP 107/76 | HR 88 | Temp 97.1°F

## 2018-05-31 DIAGNOSIS — Z113 Encounter for screening for infections with a predominantly sexual mode of transmission: Secondary | ICD-10-CM

## 2018-05-31 DIAGNOSIS — O24415 Gestational diabetes mellitus in pregnancy, controlled by oral hypoglycemic drugs: Secondary | ICD-10-CM

## 2018-05-31 DIAGNOSIS — N898 Other specified noninflammatory disorders of vagina: Secondary | ICD-10-CM | POA: Diagnosis not present

## 2018-05-31 DIAGNOSIS — O09523 Supervision of elderly multigravida, third trimester: Secondary | ICD-10-CM

## 2018-05-31 DIAGNOSIS — O0990 Supervision of high risk pregnancy, unspecified, unspecified trimester: Secondary | ICD-10-CM

## 2018-05-31 DIAGNOSIS — Z3A36 36 weeks gestation of pregnancy: Secondary | ICD-10-CM

## 2018-05-31 NOTE — Progress Notes (Signed)
   PRENATAL VISIT NOTE  Subjective:  Stacy [name] is a 38 y.o. G3P2002 at [redacted]w[redacted]d being seen today for ongoing prenatal care.  She is currently monitored for the following issues for this high-risk pregnancy and has Supervision of high risk pregnancy, antepartum; AMA (advanced maternal age) multigravida 35+; and Gestational diabetes mellitus (GDM) controlled on oral hypoglycemic drug, antepartum on their problem list.  Patient reports occasional cramping. Does have some discharge, denies itching, burning or odor. Contractions: Not present. Vag. Bleeding: None.  Movement: Present. Denies leaking of fluid.   The following portions of the patient's history were reviewed and updated as appropriate: allergies, current medications, past family history, past medical history, past social history, past surgical history and problem list.   Objective:   Vitals:   05/31/18 1544  BP: 107/76  Pulse: 88  Temp: (!) 97.1 F (36.2 C)    Fetal Status: Fetal Heart Rate (bpm): 150   Movement: Present     General:  Alert, oriented and cooperative. Patient is in no acute distress.  Skin: Skin is warm and dry. No rash noted.   Cardiovascular: Normal heart rate noted  Respiratory: Normal respiratory effort, no problems with respiration noted  Abdomen: Soft, gravid, appropriate for gestational age.  Pain/Pressure: Absent     Pelvic: Cervical exam deferred        Extremities: Normal range of motion.  Edema: Trace  Mental Status: Normal mood and affect. Normal behavior. Normal judgment and thought content.   Assessment and Plan:  Pregnancy: G3P2002 at [redacted]w[redacted]d  1. Supervision of high risk pregnancy, antepartum - Records in Care Everywhere of pap 03/2015, patient thinks she has had one since then, she will call and get records - due for pap if none sooner - reviewed induction, covid protocols with patient  2. Multigravida of advanced maternal age in third trimester  3. Gestational diabetes mellitus  (GDM) controlled on oral hypoglycemic drug, antepartum Glyburide 2.5 mg QHS, 1.25 mg with breakfast FG: all 95 or under PP: all 120 or under, exception of 2 Has BPP scheduled for tomorrow  Preterm labor symptoms and general obstetric precautions including but not limited to vaginal bleeding, contractions, leaking of fluid and fetal movement were reviewed in detail with the patient. Please refer to After Visit Summary for other counseling recommendations.   Return in about 1 week (around 06/07/2018) for OB visit (MD), virtual.  Future Appointments  Date Time Provider Department Center  06/01/2018  8:00 AM [facility] NURSE [facility]  06/01/2018  8:00 AM [facility] [place] 3 [facility]    Conan Bowens, MD

## 2018-06-01 ENCOUNTER — Encounter (HOSPITAL_COMMUNITY): Payer: Self-pay

## 2018-06-01 ENCOUNTER — Ambulatory Visit (HOSPITAL_COMMUNITY)
Admission: RE | Admit: 2018-06-01 | Discharge: 2018-06-01 | Disposition: A | Discharge status: Home / Self Care | Payer: BC Managed Care – PPO | Source: Ambulatory Visit | Attending: Obstetrics and Gynecology | Admitting: Obstetrics and Gynecology

## 2018-06-01 ENCOUNTER — Ambulatory Visit (HOSPITAL_COMMUNITY): Payer: BC Managed Care – PPO | Admitting: *Deleted

## 2018-06-01 ENCOUNTER — Other Ambulatory Visit (HOSPITAL_COMMUNITY): Payer: Self-pay | Admitting: *Deleted

## 2018-06-01 VITALS — BP 103/63 | HR 76 | Temp 98.1°F

## 2018-06-01 DIAGNOSIS — Z3A36 36 weeks gestation of pregnancy: Secondary | ICD-10-CM | POA: Diagnosis not present

## 2018-06-01 DIAGNOSIS — Z362 Encounter for other antenatal screening follow-up: Secondary | ICD-10-CM | POA: Diagnosis not present

## 2018-06-01 DIAGNOSIS — O24415 Gestational diabetes mellitus in pregnancy, controlled by oral hypoglycemic drugs: Secondary | ICD-10-CM | POA: Diagnosis present

## 2018-06-01 DIAGNOSIS — O24410 Gestational diabetes mellitus in pregnancy, diet controlled: Secondary | ICD-10-CM | POA: Insufficient documentation

## 2018-06-01 DIAGNOSIS — O0990 Supervision of high risk pregnancy, unspecified, unspecified trimester: Secondary | ICD-10-CM | POA: Diagnosis present

## 2018-06-01 DIAGNOSIS — O09523 Supervision of elderly multigravida, third trimester: Secondary | ICD-10-CM | POA: Diagnosis not present

## 2018-06-01 LAB — CERVICOVAGINAL ANCILLARY ONLY
Bacterial vaginitis: NEGATIVE
Candida vaginitis: NEGATIVE
Chlamydia: NEGATIVE
Neisseria Gonorrhea: NEGATIVE
Trichomonas: NEGATIVE

## 2018-06-04 LAB — CULTURE, BETA STREP (GROUP B ONLY): Strep Gp B Culture: NEGATIVE

## 2018-06-07 ENCOUNTER — Ambulatory Visit (INDEPENDENT_AMBULATORY_CARE_PROVIDER_SITE_OTHER): Payer: BC Managed Care – PPO | Admitting: Obstetrics

## 2018-06-07 ENCOUNTER — Telehealth (HOSPITAL_COMMUNITY): Payer: Self-pay | Admitting: *Deleted

## 2018-06-07 ENCOUNTER — Encounter: Payer: Self-pay | Admitting: Obstetrics

## 2018-06-07 ENCOUNTER — Encounter (HOSPITAL_COMMUNITY): Payer: Self-pay | Admitting: *Deleted

## 2018-06-07 VITALS — BP 112/84 | Wt 165.0 lb

## 2018-06-07 DIAGNOSIS — O0993 Supervision of high risk pregnancy, unspecified, third trimester: Secondary | ICD-10-CM

## 2018-06-07 DIAGNOSIS — Z3A37 37 weeks gestation of pregnancy: Secondary | ICD-10-CM

## 2018-06-07 DIAGNOSIS — O0990 Supervision of high risk pregnancy, unspecified, unspecified trimester: Secondary | ICD-10-CM

## 2018-06-07 DIAGNOSIS — O24415 Gestational diabetes mellitus in pregnancy, controlled by oral hypoglycemic drugs: Secondary | ICD-10-CM

## 2018-06-07 DIAGNOSIS — O09523 Supervision of elderly multigravida, third trimester: Secondary | ICD-10-CM

## 2018-06-07 NOTE — Progress Notes (Addendum)
TELEHEALTH OBSTETRICS PRENATAL VIRTUAL VIDEO VISIT ENCOUNTER NOTE  Provider location: [facility] [place]   I connected with Stacy [name] on 06/07/18 at  9:45 AM EDT by WebEx OB MyChart Video Encounter at home and verified that I am speaking with the correct person using two identifiers.   I discussed the limitations, risks, security and privacy concerns of performing an evaluation and management service by telephone and the availability of in person appointments. I also discussed with the patient that there may be a patient responsible charge related to this service. The patient expressed understanding and agreed to proceed. Subjective:  Stacy [name] is a 38 y.o. G3P2002 at [redacted]w[redacted]d being seen today for ongoing prenatal care.  She is currently monitored for the following issues for this high-risk pregnancy and has Supervision of high risk pregnancy, antepartum; AMA (advanced maternal age) multigravida 35+; and Gestational diabetes mellitus (GDM) controlled on oral hypoglycemic drug, antepartum on their problem list.  Patient reports difficulty getting to sleep.  Contractions: Irritability. Vag. Bleeding: None.  Movement: Present. Denies any leaking of fluid.   The following portions of the patient's history were reviewed and updated as appropriate: allergies, current medications, past family history, past medical history, past social history, past surgical history and problem list.   Objective:   Vitals:   06/07/18 1003  BP: 112/84  Weight: 165 lb (74.8 kg)    Fetal Status:     Movement: Present     General:  Alert, oriented and cooperative. Patient is in no acute distress.  Respiratory: Normal respiratory effort, no problems with respiration noted  Mental Status: Normal mood and affect. Normal behavior. Normal judgment and thought content.  Rest of physical exam deferred due to type of encounter  Imaging: [place] Mfm Fetal Bpp Wo Non Stress  Result  Date: 06/01/2018 ----------------------------------------------------------------------  OBSTETRICS REPORT                       (Signed Final 06/01/2018 11:14 am) ---------------------------------------------------------------------- Patient Info  ID #:       [id number]                          D.O.B.:  04-17-1980 (38 yrs)  Name:       Stacy [name] [facility]             Visit Date: 06/01/2018 07:53 am ---------------------------------------------------------------------- Performed By  Performed By:     Sandi Mealy        Ref. Address:     [address]                                                             [phone]  [place]                                                             [address]  Attending:        Noralee Space MD        [facility]  Referred By:      [facility] ---------------------------------------------------------------------- Orders   #  Description                          Code         Ordered By   1  [place] MFM OB FOLLOW UP                  [id number]     Lin Landsman   2  [place] MFM FETAL BPP WO NON              76819.01     [facility]      STRESS                                            BOOKER  ----------------------------------------------------------------------   #  Order #                    Accession #                 Episode #   1  [id number]                  [id number]                  [id number]   2  [id number]                  [id number]                  [id number]  ---------------------------------------------------------------------- Indications   Encounter for other antenatal screening        Z36.2   follow-up   Advanced maternal age multigravida 13+,        O58.523   third  trimester(Low Risk NIPS, Negative AFP)   Gestational diabetes in pregnancy,             O24.415   controlled by oral hypoglycemic drugs   (glyburide)   [redacted] weeks gestation of pregnancy  Z3A.36  ---------------------------------------------------------------------- Vital Signs  Weight (lb): 159                               Height:        5'1"  BMI:         30.04 ---------------------------------------------------------------------- Fetal Evaluation  Num Of Fetuses:         1  Fetal Heart Rate(bpm):  142  Cardiac Activity:       Observed  Presentation:           Cephalic  Placenta:               Posterior  P. Cord Insertion:      Previously Visualized  Amniotic Fluid  AFI FV:      Within normal limits  AFI Sum(cm)     %Tile       Largest Pocket(cm)  12.39           40          3.79  RUQ(cm)       RLQ(cm)       LUQ(cm)        LLQ(cm)  3.79          2.75          2.62           3.23 ---------------------------------------------------------------------- Biophysical Evaluation  Amniotic F.V:   Within normal limits       F. Tone:        Observed  F. Movement:    Observed                   Score:          8/8  F. Breathing:   Observed ---------------------------------------------------------------------- Biometry  BPD:      82.9  mm     G. Age:  33w 3d          2  %    CI:         74.1   %    70 - 86                                                          FL/HC:      22.2   %    20.1 - 22.1  HC:      305.8  mm     G. Age:  34w 0d        < 3  %    HC/AC:      0.95        0.93 - 1.11  AC:      321.1  mm     G. Age:  36w 0d         54  %    FL/BPD:     82.0   %    71 - 87  FL:         68  mm     G. Age:  34w 6d         17  %    FL/AC:      21.2   %    20 - 24  HUM:      56.6  mm  G. Age:  32w 6d          5  %  LV:        2.5  mm  Est. FW:    2627  gm    5 lb 13 oz      40  % ---------------------------------------------------------------------- OB History  Gravidity:    3         Term:   2        Prem:   0         SAB:   0  TOP:          0       Ectopic:  0        Living: 2 ---------------------------------------------------------------------- Gestational Age  LMP:           41w 0d        Date:  08/18/17                 EDD:   05/25/18  U/S Today:     34w 4d                                        EDD:   07/09/18  Best:          36w 2d     Det. ByMarcella Dubs         EDD:   06/27/18                                      (11/02/17) ---------------------------------------------------------------------- Anatomy  Cranium:               Appears normal         Aortic Arch:            Previously seen  Cavum:                 Appears normal         Ductal Arch:            Previously seen  Ventricles:            Appears normal         Diaphragm:              Appears normal  Choroid Plexus:        Previously seen        Stomach:                Appears normal, left                                                                        sided  Cerebellum:            Previously seen        Abdomen:                Previously seen  Posterior Fossa:       Previously seen        Abdominal Wall:  Appears nml (cord                                                                        insert, abd wall)  Nuchal Fold:           Previously seen        Cord Vessels:           Previously seen  Face:                  Orbits and profile     Kidneys:                Appear normal                         previously seen  Lips:                  Previously seen        Bladder:                Appears normal  Thoracic:              Appears normal         Spine:                  Previously seen  Heart:                 Appears normal         Upper Extremities:      Previously seen                         (4CH, axis, and                         situs)  RVOT:                  Previously seen        Lower Extremities:      Previously seen  LVOT:                  Previously seen ----------------------------------------------------------------------  Impression  Amniotic fluid is normal and good fetal activity is seen. Fetal  growth is appropriate for gestational age. Antenatal testing is  reassuring. BPP 8/8.  Gestational diabetes. Patient takes oral hypoglycemics. ---------------------------------------------------------------------- Recommendations  -Continue weekly BPP till delivery. ----------------------------------------------------------------------                  Noralee Space, MD Electronically Signed Final Report   06/01/2018 11:14 am ----------------------------------------------------------------------  [place] Mfm Fetal Bpp Wo Non Stress  Result Date: 05/18/2018 ----------------------------------------------------------------------  OBSTETRICS REPORT                       (Signed Final 05/18/2018 09:00 am) ---------------------------------------------------------------------- Patient Info  ID #:       [id number]                          D.O.B.:  1980-05-04 (37 yrs)  Name:       Stacy [name]  Visit Date: 05/18/2018 07:58 am ---------------------------------------------------------------------- Performed By  Performed By:     Sandi Mealy        Ref. Address:     [address]                                                             [address]                                                             [place]                                                             [address]  Attending:        Lin Landsman      [facility]                    MD                                       [facility]etal [facility]  Referred By:      [facility] ---------------------------------------------------------------------- Orders   #  Description                          Code         Ordered By   1  [place] MFM FETAL BPP WO NON              60454.09     [facility]      STRESS                                            BOOKER   ----------------------------------------------------------------------   #  Order #                    Accession #                 Episode #   1  [id number]                  [id number]  [id number]  ---------------------------------------------------------------------- Indications   Advanced maternal age multigravida 5235+,        14O09.523   third trimester(Low Risk NIPS, Negative AFP)   Gestational diabetes in pregnancy,             O24.415   controlled by oral hypoglycemic drugs   (glyburide)   [redacted] weeks gestation of pregnancy                Z3A.34  ---------------------------------------------------------------------- Vital Signs  Weight (lb): 159                               Height:        5'1"  BMI:         30.04 ---------------------------------------------------------------------- Fetal Evaluation  Num Of Fetuses:         1  Fetal Heart Rate(bpm):  143  Cardiac Activity:       Observed  Presentation:           Cephalic  Placenta:               Posterior  P. Cord Insertion:      Previously Visualized  Amniotic Fluid  AFI FV:      Within normal limits ---------------------------------------------------------------------- Biophysical Evaluation  Amniotic F.V:   Within normal limits       F. Tone:        Observed  F. Movement:    Observed                   Score:          8/8  F. Breathing:   Observed ---------------------------------------------------------------------- OB History  Gravidity:    3         Term:   2        Prem:   0        SAB:   0  TOP:          0       Ectopic:  0        Living: 2 ---------------------------------------------------------------------- Gestational Age  LMP:           39w 0d        Date:  08/18/17                 EDD:   05/25/18  Best:          34w 2d     Det. By:  Marcella DubsEarly Ultrasound         EDD:   06/27/18                                      (11/02/17) ---------------------------------------------------------------------- Anatomy  Thoracic:              Appears normal          Abdomen:                Appears normal  Diaphragm:             Appears normal         Kidneys:                Appear normal  Stomach:               Appears normal, left   Bladder:  Appears normal                         sided ---------------------------------------------------------------------- Cervix Uterus Adnexa  Cervix  Not visualized (advanced GA >24wks) ---------------------------------------------------------------------- Impression  Biophyiscal profile 8/8 ---------------------------------------------------------------------- Recommendations  Continue weekly BPP ----------------------------------------------------------------------               Lin Landsman, MD Electronically Signed Final Report   05/18/2018 09:00 am ----------------------------------------------------------------------  [place] Mfm Fetal Bpp Wo Non Stress  Result Date: 05/11/2018 ----------------------------------------------------------------------  OBSTETRICS REPORT                       (Signed Final 05/11/2018 11:49 am) ---------------------------------------------------------------------- Patient Info  ID #:       [id number]                          D.O.B.:  [birth year] (37 yrs)  Name:       Stacy [name] [facility]             Visit Date: 05/11/2018 07:57 am ---------------------------------------------------------------------- Performed By  Performed By:     Emeline Darling BS,      Ref. Address:     [address]                                                             [address]                                                             [place]                                                             [address]  Attending:        Lin Landsman      [facility]                    MD                                       [facility]  Referred By:      [facility]  ---------------------------------------------------------------------- Orders   #  Description  Code         Ordered By   1  [place] MFM FETAL BPP WO NON              [id number]     [facility]      STRESS                                            BOOKER  ----------------------------------------------------------------------   #  Order #                    Accession #                 Episode #   1  [id number]                  [id number]                  [id number]  ---------------------------------------------------------------------- Indications   [redacted] weeks gestation of pregnancy                Z3A.62   Advanced maternal age multigravida 57+,        O70.523   third trimester(Low Risk NIPS, Negative AFP)   Gestational diabetes in pregnancy,             O24.415   controlled by oral hypoglycemic drugs   (glyburide)  ---------------------------------------------------------------------- Vital Signs  Weight (lb): 159                               Height:        5'1"  BMI:         30.04 ---------------------------------------------------------------------- Fetal Evaluation  Num Of Fetuses:         1  Fetal Heart Rate(bpm):  148  Cardiac Activity:       Observed  Presentation:           Cephalic  Placenta:               Posterior  P. Cord Insertion:      Previously Visualized  Amniotic Fluid  AFI FV:      Within normal limits  AFI Sum(cm)     %Tile       Largest Pocket(cm)  13.18           42          5.15  RUQ(cm)                     LUQ(cm)        LLQ(cm)  5.15                        4.97           3.06 ---------------------------------------------------------------------- Biophysical Evaluation  Amniotic F.V:   Pocket => 2 cm two         F. Tone:        Observed                  planes  F. Movement:    Observed                   Score:          8/8  F. Breathing:   Observed ----------------------------------------------------------------------  OB History  Gravidity:    3         Term:   2        Prem:   0         SAB:   0  TOP:          0       Ectopic:  0        Living: 2 ---------------------------------------------------------------------- Gestational Age  LMP:           38w 0d        Date:  08/18/17                 EDD:   05/25/18  Best:          33w 2d     Det. ByMarcella Dubs         EDD:   06/27/18                                      (11/02/17) ---------------------------------------------------------------------- Impression  Biophysical profile 8/8 ---------------------------------------------------------------------- Recommendations  Continue weekly BPP  Follow up growth examination scheduled on 05/22 ----------------------------------------------------------------------               Lin Landsman, MD Electronically Signed Final Report   05/11/2018 11:49 am ----------------------------------------------------------------------  [place] Mfm Ob Follow Up  Result Date: 06/01/2018 ----------------------------------------------------------------------  OBSTETRICS REPORT                       (Signed Final 06/01/2018 11:14 am) ---------------------------------------------------------------------- Patient Info  ID #:       [id number]                          D.O.B.:  [birth year] (37 yrs)  Name:       RYKER PHERIGO [facility]             Visit Date: 06/01/2018 07:53 am ---------------------------------------------------------------------- Performed By  Performed By:     Sandi Mealy        Ref. Address:     [address]                                                             [address]                                                             [place]  [address]  Attending:        Noralee Space MD        [facility]  Referred By:      [facility]  ---------------------------------------------------------------------- Orders   #  Description                          Code         Ordered By   1  [place] MFM OB FOLLOW UP                  76816.01     Lin Landsman   2  [place] MFM FETAL BPP WO NON              76819.01     [facility]      STRESS                                            BOOKER  ----------------------------------------------------------------------   #  Order #                    Accession #                 Episode #   1  [id number]                  [id number]                  [id number]   2  [id number]                  [id number]                  [id number]  ---------------------------------------------------------------------- Indications   Encounter for other antenatal screening        Z36.2   follow-up   Advanced maternal age multigravida 70+,        O13.523   third trimester(Low Risk NIPS, Negative AFP)   Gestational diabetes in pregnancy,             O24.415   controlled by oral hypoglycemic drugs   (glyburide)   [redacted] weeks gestation of pregnancy                Z3A.36  ---------------------------------------------------------------------- Vital Signs  Weight (lb): 159                               Height:        5'1"  BMI:         30.04 ---------------------------------------------------------------------- Fetal  Evaluation  Num Of Fetuses:         1  Fetal Heart Rate(bpm):  142  Cardiac Activity:       Observed  Presentation:           Cephalic  Placenta:               Posterior  P. Cord Insertion:      Previously Visualized  Amniotic Fluid  AFI FV:      Within normal limits  AFI Sum(cm)     %Tile       Largest Pocket(cm)  12.39           40          3.79  RUQ(cm)       RLQ(cm)       LUQ(cm)        LLQ(cm)  3.79          2.75          2.62           3.23 ---------------------------------------------------------------------- Biophysical Evaluation  Amniotic F.V:   Within normal limits       F.  Tone:        Observed  F. Movement:    Observed                   Score:          8/8  F. Breathing:   Observed ---------------------------------------------------------------------- Biometry  BPD:      82.9  mm     G. Age:  33w 3d          2  %    CI:         74.1   %    70 - 86                                                          FL/HC:      22.2   %    20.1 - 22.1  HC:      305.8  mm     G. Age:  34w 0d        < 3  %    HC/AC:      0.95        0.93 - 1.11  AC:      321.1  mm     G. Age:  36w 0d         54  %    FL/BPD:     82.0   %    71 - 87  FL:         68  mm     G. Age:  34w 6d         17  %    FL/AC:      21.2   %    20 - 24  HUM:      56.6  mm     G. Age:  32w 6d          5  %  LV:        2.5  mm  Est. FW:    2627  gm    5 lb 13 oz      40  % ---------------------------------------------------------------------- OB History  Gravidity:    3         Term:   2        Prem:   0        SAB:   0  TOP:          0       Ectopic:  0        Living: 2 ---------------------------------------------------------------------- Gestational Age  LMP:           41w 0d        Date:  08/18/17                 EDD:   05/25/18  U/S Today:     34w 4d                                        EDD:   07/09/18  Best:          36w 2d     Det. ByMarcella Dubs         EDD:   06/27/18                                      (11/02/17) ---------------------------------------------------------------------- Anatomy  Cranium:               Appears normal         Aortic Arch:            Previously seen  Cavum:                 Appears normal         Ductal Arch:            Previously seen  Ventricles:            Appears normal         Diaphragm:              Appears normal  Choroid Plexus:        Previously seen        Stomach:                Appears normal, left                                                                        sided  Cerebellum:            Previously seen        Abdomen:                Previously seen  Posterior Fossa:        Previously seen        Abdominal Wall:         Appears nml (cord  insert, abd wall)  Nuchal Fold:           Previously seen        Cord Vessels:           Previously seen  Face:                  Orbits and profile     Kidneys:                Appear normal                         previously seen  Lips:                  Previously seen        Bladder:                Appears normal  Thoracic:              Appears normal         Spine:                  Previously seen  Heart:                 Appears normal         Upper Extremities:      Previously seen                         (4CH, axis, and                         situs)  RVOT:                  Previously seen        Lower Extremities:      Previously seen  LVOT:                  Previously seen ---------------------------------------------------------------------- Impression  Amniotic fluid is normal and good fetal activity is seen. Fetal  growth is appropriate for gestational age. Antenatal testing is  reassuring. BPP 8/8.  Gestational diabetes. Patient takes oral hypoglycemics. ---------------------------------------------------------------------- Recommendations  -Continue weekly BPP till delivery. ----------------------------------------------------------------------                  Noralee Space, MD Electronically Signed Final Report   06/01/2018 11:14 am ----------------------------------------------------------------------   Assessment and Plan:  Pregnancy: [id number] at [redacted]w[redacted]d 1. Supervision of high risk pregnancy, antepartum  2. Multigravida of advanced maternal age in third trimester  3. Gestational diabetes mellitus (GDM) controlled on oral hypoglycemic drug, antepartum - good glucose control with FBS's < 95 and postprandials < 120  - IOL at 39 weeks  Term labor symptoms and general obstetric precautions including but not limited to vaginal bleeding, contractions, leaking  of fluid and fetal movement were reviewed in detail with the patient. I discussed the assessment and treatment plan with the patient. The patient was provided an opportunity to ask questions and all were answered. The patient agreed with the plan and demonstrated an understanding of the instructions. The patient was advised to call back or seek an in-person office evaluation/go to [facility] at [facility] for any urgent or concerning symptoms. Please refer to After Visit Summary for other counseling recommendations.   I provided 10 minutes of face-to-face time during this encounter.  Return in about 1 week (around 06/14/2018)  for [facility] in office.  Future Appointments  Date Time Provider Department Center  06/08/2018  8:15 AM WH-MFC NURSE WH-MFC MFC-US  06/08/2018  8:15 AM WH-MFC [place] 4 WH-MFCUS MFC-US  06/15/2018  8:00 AM WH-MFC NURSE WH-MFC MFC-US  06/15/2018  8:00 AM WH-MFC [place] 3 WH-MFCUS MFC-US    Coral Ceo, MD [facility] 06-07-2018

## 2018-06-07 NOTE — Progress Notes (Signed)
Pt is on the phone preparing for virtual visit. [redacted]w[redacted]d. Pt has been checking BG levels at home: fasting (highest 95) after meals (120)

## 2018-06-07 NOTE — Telephone Encounter (Signed)
Preadmission screen  

## 2018-06-08 ENCOUNTER — Other Ambulatory Visit: Payer: Self-pay

## 2018-06-08 ENCOUNTER — Encounter (HOSPITAL_COMMUNITY): Payer: Self-pay

## 2018-06-08 ENCOUNTER — Ambulatory Visit (HOSPITAL_COMMUNITY)
Admission: RE | Admit: 2018-06-08 | Discharge: 2018-06-08 | Disposition: A | Discharge status: Home / Self Care | Payer: BC Managed Care – PPO | Source: Ambulatory Visit | Attending: Obstetrics and Gynecology | Admitting: Obstetrics and Gynecology

## 2018-06-08 ENCOUNTER — Ambulatory Visit (HOSPITAL_COMMUNITY): Payer: BC Managed Care – PPO | Admitting: *Deleted

## 2018-06-08 VITALS — BP 115/66 | HR 80 | Temp 98.6°F

## 2018-06-08 DIAGNOSIS — O09523 Supervision of elderly multigravida, third trimester: Secondary | ICD-10-CM

## 2018-06-08 DIAGNOSIS — O24415 Gestational diabetes mellitus in pregnancy, controlled by oral hypoglycemic drugs: Secondary | ICD-10-CM | POA: Diagnosis present

## 2018-06-08 DIAGNOSIS — Z3A37 37 weeks gestation of pregnancy: Secondary | ICD-10-CM

## 2018-06-14 ENCOUNTER — Ambulatory Visit (INDEPENDENT_AMBULATORY_CARE_PROVIDER_SITE_OTHER): Payer: BC Managed Care – PPO | Admitting: Obstetrics and Gynecology

## 2018-06-14 ENCOUNTER — Encounter: Payer: Self-pay | Admitting: Obstetrics and Gynecology

## 2018-06-14 ENCOUNTER — Other Ambulatory Visit: Payer: Self-pay

## 2018-06-14 VITALS — BP 110/73 | HR 85 | Wt 171.0 lb

## 2018-06-14 DIAGNOSIS — O24415 Gestational diabetes mellitus in pregnancy, controlled by oral hypoglycemic drugs: Secondary | ICD-10-CM

## 2018-06-14 DIAGNOSIS — Z3A38 38 weeks gestation of pregnancy: Secondary | ICD-10-CM

## 2018-06-14 DIAGNOSIS — O09523 Supervision of elderly multigravida, third trimester: Secondary | ICD-10-CM

## 2018-06-14 DIAGNOSIS — O0990 Supervision of high risk pregnancy, unspecified, unspecified trimester: Secondary | ICD-10-CM

## 2018-06-14 NOTE — Progress Notes (Signed)
Patient reports fetal movement with pressure. Pt wants cervix checked today. Fasting BG today 91, after breakfast 118, after lunch 115.

## 2018-06-14 NOTE — Progress Notes (Signed)
   PRENATAL VISIT NOTE  Subjective:  Stacy [name] is a 38 y.o. G3P2002 at [redacted]w[redacted]d being seen today for ongoing prenatal care.  She is currently monitored for the following issues for this high-risk pregnancy and has Supervision of high risk pregnancy, antepartum; AMA (advanced maternal age) multigravida 35+; and Gestational diabetes mellitus (GDM) controlled on oral hypoglycemic drug, antepartum on their problem list.  Patient reports occasional contractions.  Contractions: Not present. Vag. Bleeding: None.  Movement: Present. Denies leaking of fluid.   The following portions of the patient's history were reviewed and updated as appropriate: allergies, current medications, past family history, past medical history, past social history, past surgical history and problem list.   Objective:   Vitals:   06/14/18 1619  BP: 110/73  Pulse: 85  Weight: 171 lb (77.6 kg)    Fetal Status: Fetal Heart Rate (bpm): 160   Movement: Present     General:  Alert, oriented and cooperative. Patient is in no acute distress.  Skin: Skin is warm and dry. No rash noted.   Cardiovascular: Normal heart rate noted  Respiratory: Normal respiratory effort, no problems with respiration noted  Abdomen: Soft, gravid, appropriate for gestational age.  Pain/Pressure: Present     Pelvic: Cervical exam performed      2/20/-3  Extremities: Normal range of motion.  Edema: Trace  Mental Status: Normal mood and affect. Normal behavior. Normal judgment and thought content.   Assessment and Plan:  Pregnancy: G3P2002 at [redacted]w[redacted]d  1. Supervision of high risk pregnancy, antepartum  2. Multigravida of advanced maternal age in third trimester  3. Gestational diabetes mellitus (GDM) controlled on oral hypoglycemic drug, antepartum On glyburide 2.5 mg QHS, 1.25 mg with breakfast IOL scheduled for 39 weeks FG: all < 95 PP: 901-110s BPP scheduled for tomorrow  Term labor symptoms and general obstetric precautions  including but not limited to vaginal bleeding, contractions, leaking of fluid and fetal movement were reviewed in detail with the patient. Please refer to After Visit Summary for other counseling recommendations.   Return in about 5 weeks (around 07/19/2018) for post partum check.  Future Appointments  Date Time Provider Department Center  06/15/2018  8:00 AM [facility]-MFC NURSE [facility]-MFC M[facility]-US  06/15/2018  8:00 AM WH-MFC [place] 3 WH-MFCUS MFC-US  06/19/2018  7:50 AM MC-MAU 1 MC-INDC None  06/21/2018  7:30 AM MC-LD SCHED ROOM MC-INDC None    Conan Bowens, MD

## 2018-06-15 ENCOUNTER — Ambulatory Visit (HOSPITAL_COMMUNITY)
Admission: RE | Admit: 2018-06-15 | Discharge: 2018-06-15 | Disposition: A | Discharge status: Home / Self Care | Payer: BC Managed Care – PPO | Source: Ambulatory Visit | Attending: Obstetrics and Gynecology | Admitting: Obstetrics and Gynecology

## 2018-06-15 ENCOUNTER — Encounter (HOSPITAL_COMMUNITY): Payer: Self-pay

## 2018-06-15 ENCOUNTER — Other Ambulatory Visit: Payer: Self-pay | Admitting: Advanced Practice Midwife

## 2018-06-15 ENCOUNTER — Ambulatory Visit (HOSPITAL_COMMUNITY): Payer: BC Managed Care – PPO | Admitting: *Deleted

## 2018-06-15 VITALS — BP 108/72 | HR 92 | Temp 98.1°F

## 2018-06-15 DIAGNOSIS — O24415 Gestational diabetes mellitus in pregnancy, controlled by oral hypoglycemic drugs: Secondary | ICD-10-CM | POA: Diagnosis not present

## 2018-06-15 DIAGNOSIS — Z3A38 38 weeks gestation of pregnancy: Secondary | ICD-10-CM

## 2018-06-15 DIAGNOSIS — O09523 Supervision of elderly multigravida, third trimester: Secondary | ICD-10-CM | POA: Diagnosis not present

## 2018-06-19 ENCOUNTER — Other Ambulatory Visit: Payer: Self-pay

## 2018-06-19 ENCOUNTER — Other Ambulatory Visit (HOSPITAL_COMMUNITY)
Admission: RE | Admit: 2018-06-19 | Discharge: 2018-06-19 | Disposition: A | Discharge status: Home / Self Care | Payer: BC Managed Care – PPO | Source: Ambulatory Visit | Attending: Obstetrics and Gynecology | Admitting: Obstetrics and Gynecology

## 2018-06-19 DIAGNOSIS — Z1159 Encounter for screening for other viral diseases: Secondary | ICD-10-CM | POA: Diagnosis present

## 2018-06-19 NOTE — MAU Note (Signed)
Asymptomatic, some resistance and difficulty passing swab both right and left side.

## 2018-06-20 ENCOUNTER — Other Ambulatory Visit (HOSPITAL_COMMUNITY): Payer: Self-pay | Admitting: *Deleted

## 2018-06-20 LAB — NOVEL CORONAVIRUS, NAA (HOSP ORDER, SEND-OUT TO REF LAB; TAT 18-24 HRS): SARS-CoV-2, NAA: NOT DETECTED

## 2018-06-21 ENCOUNTER — Inpatient Hospital Stay (HOSPITAL_COMMUNITY): Payer: BC Managed Care – PPO

## 2018-06-21 ENCOUNTER — Inpatient Hospital Stay (HOSPITAL_COMMUNITY): Payer: BC Managed Care – PPO | Admitting: Anesthesiology

## 2018-06-21 ENCOUNTER — Encounter (HOSPITAL_COMMUNITY): Payer: Self-pay | Admitting: *Deleted

## 2018-06-21 ENCOUNTER — Inpatient Hospital Stay (HOSPITAL_COMMUNITY)
Admission: AD | Admit: 2018-06-21 | Discharge: 2018-06-23 | DRG: 807 | Disposition: A | Discharge status: Home / Self Care | Payer: BC Managed Care – PPO | Attending: Obstetrics and Gynecology | Admitting: Obstetrics and Gynecology

## 2018-06-21 DIAGNOSIS — O24419 Gestational diabetes mellitus in pregnancy, unspecified control: Secondary | ICD-10-CM

## 2018-06-21 DIAGNOSIS — O24420 Gestational diabetes mellitus in childbirth, diet controlled: Secondary | ICD-10-CM | POA: Diagnosis present

## 2018-06-21 DIAGNOSIS — Z3A39 39 weeks gestation of pregnancy: Secondary | ICD-10-CM

## 2018-06-21 DIAGNOSIS — O24415 Gestational diabetes mellitus in pregnancy, controlled by oral hypoglycemic drugs: Secondary | ICD-10-CM

## 2018-06-21 DIAGNOSIS — O0990 Supervision of high risk pregnancy, unspecified, unspecified trimester: Secondary | ICD-10-CM

## 2018-06-21 DIAGNOSIS — Z87891 Personal history of nicotine dependence: Secondary | ICD-10-CM | POA: Diagnosis not present

## 2018-06-21 DIAGNOSIS — O26893 Other specified pregnancy related conditions, third trimester: Secondary | ICD-10-CM | POA: Diagnosis present

## 2018-06-21 HISTORY — DX: Gestational diabetes mellitus in pregnancy, unspecified control: O24.419

## 2018-06-21 LAB — GLUCOSE, CAPILLARY
Glucose-Capillary: 74 mg/dL (ref 70–99)
Glucose-Capillary: 92 mg/dL (ref 70–99)
Glucose-Capillary: 102 mg/dL — ABNORMAL HIGH (ref 70–99)

## 2018-06-21 LAB — CBC
HCT: 33.5 % — ABNORMAL LOW (ref 36.0–46.0)
Hemoglobin: 11 g/dL — ABNORMAL LOW (ref 12.0–15.0)
MCH: 27.1 pg (ref 26.0–34.0)
MCHC: 32.8 g/dL (ref 30.0–36.0)
MCV: 82.5 fL (ref 80.0–100.0)
Platelets: 231 10*3/uL (ref 150–400)
RBC: 4.06 MIL/uL (ref 3.87–5.11)
RDW: 14.7 % (ref 11.5–15.5)
WBC: 7.2 10*3/uL (ref 4.0–10.5)
nRBC: 0 % (ref 0.0–0.2)

## 2018-06-21 LAB — TYPE AND SCREEN
ABO/RH(D): AB POS
Antibody Screen: NEGATIVE

## 2018-06-21 LAB — RPR: RPR Ser Ql: NONREACTIVE

## 2018-06-21 MED ORDER — OXYCODONE-ACETAMINOPHEN 5-325 MG PO TABS: 2.0000 | ORAL_TABLET | ORAL | Status: DC | PRN

## 2018-06-21 MED ORDER — PRENATAL MULTIVITAMIN CH
1.0000 | ORAL_TABLET | Freq: Every day | ORAL | Status: DC
  Administered 2018-06-22: 12:00:00 1 via ORAL
  Filled 2018-06-21: qty 1

## 2018-06-21 MED ORDER — OXYTOCIN 40 UNITS IN NORMAL SALINE INFUSION - SIMPLE MED
1.0000 m[IU]/min | INTRAVENOUS | Status: DC
  Administered 2018-06-21: 13:00:00 2 m[IU]/min via INTRAVENOUS

## 2018-06-21 MED ORDER — FENTANYL CITRATE (PF) 100 MCG/2ML IJ SOLN: 100.0000 ug | INTRAMUSCULAR | Status: DC | PRN

## 2018-06-21 MED ORDER — IBUPROFEN 600 MG PO TABS
600.0000 mg | ORAL_TABLET | Freq: Four times a day (QID) | ORAL | Status: DC
  Administered 2018-06-22 – 2018-06-23 (×6): 600 mg via ORAL
  Filled 2018-06-21 (×6): qty 1

## 2018-06-21 MED ORDER — MISOPROSTOL 50MCG HALF TABLET
50.0000 ug | ORAL_TABLET | ORAL | Status: DC
  Administered 2018-06-21: 09:00:00 50 ug via ORAL

## 2018-06-21 MED ORDER — SIMETHICONE 80 MG PO CHEW: 80.0000 mg | CHEWABLE_TABLET | ORAL | Status: DC | PRN

## 2018-06-21 MED ORDER — WITCH HAZEL-GLYCERIN EX PADS: 1.0000 "application " | MEDICATED_PAD | CUTANEOUS | Status: DC | PRN

## 2018-06-21 MED ORDER — ONDANSETRON HCL 4 MG/2ML IJ SOLN: 4.0000 mg | Freq: Four times a day (QID) | INTRAMUSCULAR | Status: DC | PRN

## 2018-06-21 MED ORDER — LACTATED RINGERS IV SOLN
INTRAVENOUS | Status: DC
  Administered 2018-06-21: 08:00:00 125 mL/h via INTRAVENOUS

## 2018-06-21 MED ORDER — COCONUT OIL OIL: 1.0000 "application " | TOPICAL_OIL | Status: DC | PRN

## 2018-06-21 MED ORDER — MISOPROSTOL 50MCG HALF TABLET
ORAL_TABLET | ORAL | Status: AC
  Filled 2018-06-21: qty 1

## 2018-06-21 MED ORDER — EPHEDRINE 5 MG/ML INJ: 10.0000 mg | INTRAVENOUS | Status: DC | PRN

## 2018-06-21 MED ORDER — PHENYLEPHRINE 40 MCG/ML (10ML) SYRINGE FOR IV PUSH (FOR BLOOD PRESSURE SUPPORT): 80.0000 ug | PREFILLED_SYRINGE | INTRAVENOUS | Status: DC | PRN

## 2018-06-21 MED ORDER — DIPHENHYDRAMINE HCL 25 MG PO CAPS: 25.0000 mg | ORAL_CAPSULE | Freq: Four times a day (QID) | ORAL | Status: DC | PRN

## 2018-06-21 MED ORDER — MEASLES, MUMPS & RUBELLA VAC IJ SOLR: 0.5000 mL | Freq: Once | INTRAMUSCULAR | Status: DC

## 2018-06-21 MED ORDER — OXYTOCIN BOLUS FROM INFUSION
500.0000 mL | Freq: Once | INTRAVENOUS | Status: AC
  Administered 2018-06-21: 21:00:00 500 mL via INTRAVENOUS

## 2018-06-21 MED ORDER — ONDANSETRON HCL 4 MG/2ML IJ SOLN: 4.0000 mg | INTRAMUSCULAR | Status: DC | PRN

## 2018-06-21 MED ORDER — GLYBURIDE 2.5 MG PO TABS
2.5000 mg | ORAL_TABLET | Freq: Two times a day (BID) | ORAL | Status: DC
  Administered 2018-06-22: 08:00:00 2.5 mg via ORAL
  Filled 2018-06-21: qty 1

## 2018-06-21 MED ORDER — DIBUCAINE (PERIANAL) 1 % EX OINT: 1.0000 "application " | TOPICAL_OINTMENT | CUTANEOUS | Status: DC | PRN

## 2018-06-21 MED ORDER — ACETAMINOPHEN 325 MG PO TABS
650.0000 mg | ORAL_TABLET | ORAL | Status: DC | PRN
  Administered 2018-06-22 (×2): 650 mg via ORAL
  Filled 2018-06-21 (×2): qty 2

## 2018-06-21 MED ORDER — ACETAMINOPHEN 325 MG PO TABS: 650.0000 mg | ORAL_TABLET | ORAL | Status: DC | PRN

## 2018-06-21 MED ORDER — BENZOCAINE-MENTHOL 20-0.5 % EX AERO: 1.0000 "application " | INHALATION_SPRAY | CUTANEOUS | Status: DC | PRN

## 2018-06-21 MED ORDER — FENTANYL-BUPIVACAINE-NACL 0.5-0.125-0.9 MG/250ML-% EP SOLN
12.0000 mL/h | EPIDURAL | Status: DC | PRN
  Filled 2018-06-21: qty 250

## 2018-06-21 MED ORDER — MISOPROSTOL 25 MCG QUARTER TABLET: 25.0000 ug | ORAL_TABLET | ORAL | Status: DC | PRN

## 2018-06-21 MED ORDER — LACTATED RINGERS IV SOLN: 500.0000 mL | Freq: Once | INTRAVENOUS | Status: DC

## 2018-06-21 MED ORDER — ONDANSETRON HCL 4 MG PO TABS: 4.0000 mg | ORAL_TABLET | ORAL | Status: DC | PRN

## 2018-06-21 MED ORDER — SOD CITRATE-CITRIC ACID 500-334 MG/5ML PO SOLN: 30.0000 mL | ORAL | Status: DC | PRN

## 2018-06-21 MED ORDER — OXYTOCIN 40 UNITS IN NORMAL SALINE INFUSION - SIMPLE MED
2.5000 [IU]/h | INTRAVENOUS | Status: DC
  Administered 2018-06-21: 22:00:00 2.5 [IU]/h via INTRAVENOUS
  Filled 2018-06-21: qty 1000

## 2018-06-21 MED ORDER — LIDOCAINE HCL (PF) 1 % IJ SOLN: 30.0000 mL | INTRAMUSCULAR | Status: DC | PRN

## 2018-06-21 MED ORDER — TERBUTALINE SULFATE 1 MG/ML IJ SOLN: 0.2500 mg | Freq: Once | INTRAMUSCULAR | Status: DC | PRN

## 2018-06-21 MED ORDER — SODIUM CHLORIDE (PF) 0.9 % IJ SOLN
INTRAMUSCULAR | Status: DC | PRN
  Administered 2018-06-21: 14 mL/h via EPIDURAL

## 2018-06-21 MED ORDER — POLYETHYLENE GLYCOL 3350 17 G PO PACK
17.0000 g | PACK | Freq: Every day | ORAL | Status: DC
  Administered 2018-06-22: 17 g via ORAL
  Filled 2018-06-21 (×2): qty 1

## 2018-06-21 MED ORDER — TETANUS-DIPHTH-ACELL PERTUSSIS 5-2.5-18.5 LF-MCG/0.5 IM SUSP
0.5000 mL | Freq: Once | INTRAMUSCULAR | Status: AC
  Administered 2018-06-22: 10:00:00 0.5 mL via INTRAMUSCULAR
  Filled 2018-06-21: qty 0.5

## 2018-06-21 MED ORDER — LIDOCAINE-EPINEPHRINE (PF) 2 %-1:200000 IJ SOLN
INTRAMUSCULAR | Status: DC | PRN
  Administered 2018-06-21 (×2): 3 mL via EPIDURAL

## 2018-06-21 MED ORDER — DIPHENHYDRAMINE HCL 50 MG/ML IJ SOLN: 12.5000 mg | INTRAMUSCULAR | Status: DC | PRN

## 2018-06-21 MED ORDER — PHENYLEPHRINE 40 MCG/ML (10ML) SYRINGE FOR IV PUSH (FOR BLOOD PRESSURE SUPPORT)
80.0000 ug | PREFILLED_SYRINGE | INTRAVENOUS | Status: DC | PRN
  Filled 2018-06-21: qty 10

## 2018-06-21 MED ORDER — DOCUSATE SODIUM 100 MG PO CAPS
100.0000 mg | ORAL_CAPSULE | Freq: Two times a day (BID) | ORAL | Status: DC
  Administered 2018-06-22 – 2018-06-23 (×3): 100 mg via ORAL
  Filled 2018-06-21 (×3): qty 1

## 2018-06-21 MED ORDER — LACTATED RINGERS IV SOLN: 500.0000 mL | INTRAVENOUS | Status: DC | PRN

## 2018-06-21 MED ORDER — OXYCODONE-ACETAMINOPHEN 5-325 MG PO TABS: 1.0000 | ORAL_TABLET | ORAL | Status: DC | PRN

## 2018-06-21 NOTE — H&P (Signed)
OBSTETRIC ADMISSION HISTORY AND PHYSICAL  Stacy [name] is a 38 y.o. female G3P2002 with IUP at [redacted]w[redacted]d presenting for IOL. She reports +FMs. No LOF or VB, blurry vision, headaches, peripheral edema, or RUQ pain. She plans on breastfeeding. She requests Paragard IUD for birth control.  Dating: By LMP --->  Estimated Date of Delivery: 06/27/18  Sono:    @[redacted]w[redacted]d, CWD, normal anatomy, cephalic presentation, 2627g, 40%ile, EFW 5'13  Prenatal History/Complications: -AMA -A1GDM- well controlled  Past Medical History: Past Medical History:  Diagnosis Date  . Gestational diabetes    glyburide    Past Surgical History: Past Surgical History:  Procedure Laterality Date  . MULTIPLE TOOTH EXTRACTIONS Bilateral    for orthodontic care    Obstetrical History: OB History    Gravida  3   Para  2   Term  2   Preterm      AB      Living  2     SAB      TAB      Ectopic      Multiple      Live Births  2           Social History: Social History   Socioeconomic History  . Marital status: Married    Spouse name: Not on file  . Number of children: Not on file  . Years of education: Not on file  . Highest education level: Not on file  Occupational History  . Not on file  Social Needs  . Financial resource strain: Not hard at all  . Food insecurity    Worry: Never true    Inability: Never true  . Transportation needs    Medical: No    Non-medical: Not on file  Tobacco Use  . Smoking status: Former Smoker    Quit date: 06/28/2001    Years since quitting: 16.9  . Smokeless tobacco: Never Used  Substance and Sexual Activity  . Alcohol use: Not Currently    Alcohol/week: 0.0 standard drinks    Comment: Ocassionally  . Drug use: No  . Sexual activity: Yes    Partners: Male  Lifestyle  . Physical activity    Days per week: Not on file    Minutes per session: Not on file  . Stress: Only a little  Relationships  . Social connections    Talks on phone:  Not on file    Gets together: Not on file    Attends religious service: Not on file    Active member of club or organization: Not on file    Attends meetings of clubs or organizations: Not on file    Relationship status: Not on file  Other Topics Concern  . Not on file  Social History Narrative  . Not on file    Family History: Family History  Problem Relation Age of Onset  . Diabetes Mother   . Hyperlipidemia Mother   . Diabetes Father   . Cancer Neg Hx     Allergies: No Known Allergies  Medications Prior to Admission  Medication Sig Dispense Refill Last Dose  . Blood Pressure Monitor KIT 1 Device by Does not apply route daily. 1 each 0   . glucose blood test strip Use one strip as directed 4 times daily to check blood glucose level. 100 each 6   . glyBURIDE (DIABETA) 2.5 MG tablet Take 1 tablet (2.5 mg total) by mouth 2 (two) times daily with a meal. 60 tablet   3   . Omega-3-6-9 CAPS Take by mouth.     Glory Rosebush DELICA LANCETS 08Q MISC 1 each by Does not apply route 4 (four) times daily. 100 each 6   . prenatal vitamin w/FE, FA (PRENATAL 1 + 1) 27-1 MG TABS tablet Take 1 tablet by mouth daily at 12 noon. 30 each 0   . triamcinolone ointment (KENALOG) 0.5 % Apply 1 application topically 2 (two) times daily. (Patient not taking: Reported on 06/01/2018) 60 g 2     Review of Systems   All systems reviewed and negative except as stated in HPI  Height 5' 2" (1.575 m), weight 77.6 kg, last menstrual period 08/18/2017, currently breastfeeding. General appearance: alert, cooperative and no distress Lungs: regular rate and effort Heart: regular rate  Abdomen: soft, non-tender Extremities: Homans sign is negative, no sign of DVT Presentation: cephalic Fetal monitoringBaseline: 140 bpm, Variability: Good {> 6 bpm), Accelerations: Reactive and Decelerations: Absent Uterine activity irregular Dilation: 1.5 Effacement (%): Thick Exam by:: M Zephyr Sausedo EFW by Leopolds 7.5  lbs  Prenatal labs: ABO, Rh: AB/Positive/-- (11/27 1001) Antibody: Comment, See Final Results (11/27 1001) Rubella: 1.38 (11/27 1001) RPR: Non Reactive (03/17 1233)  HBsAg: Negative (11/27 1001)  HIV: Non Reactive (03/17 1233)  GBS:  neg  2 hr GTT 94/184/161  Prenatal Transfer Tool  Maternal Diabetes: Yes:  Diabetes Type:  Diet controlled Genetic Screening: Normal Maternal Ultrasounds/Referrals: Normal Fetal Ultrasounds or other Referrals:  Referred to Materal Fetal Medicine  Maternal Substance Abuse:  No Significant Maternal Medications:  None Significant Maternal Lab Results: None  Results for orders placed or performed during the hospital encounter of 06/21/18 (from the past 24 hour(s))  CBC   Collection Time: 06/21/18  8:21 AM  Result Value Ref Range   WBC 7.2 4.0 - 10.5 K/uL   RBC 4.06 3.87 - 5.11 MIL/uL   Hemoglobin 11.0 (L) 12.0 - 15.0 g/dL   HCT 33.5 (L) 36.0 - 46.0 %   MCV 82.5 80.0 - 100.0 fL   MCH 27.1 26.0 - 34.0 pg   MCHC 32.8 30.0 - 36.0 g/dL   RDW 14.7 11.5 - 15.5 %   Platelets 231 150 - 400 K/uL   nRBC 0.0 0.0 - 0.2 %  Glucose, capillary   Collection Time: 06/21/18  8:26 AM  Result Value Ref Range   Glucose-Capillary 74 70 - 99 mg/dL    Patient Active Problem List   Diagnosis Date Noted  . Gestational diabetes mellitus (GDM) in third trimester 06/21/2018  . Gestational diabetes mellitus (GDM) controlled on oral hypoglycemic drug, antepartum 01/11/2018  . Supervision of high risk pregnancy, antepartum 11/02/2017  . AMA (advanced maternal age) multigravida 35+ 11/02/2017    Assessment: Stacy [name] is a 38 y.o. G3P2002 at 75w1dhere for IOL for GDM  1. Labor: latent 2. FWB: Cat I 3. Pain: analgesia/anesthesia prn 4. GBS: neg   Plan: Admit to LD FB/Cytotec for ripening then Pitocin CBG q4 Anticipate SVD  MJulianne Handler CNM  06/21/2018, 9:02 AM

## 2018-06-21 NOTE — Progress Notes (Addendum)
Labor Progress Note Stacy [name] is a 38 y.o. G3P2002 at [redacted]w[redacted]d presented for IOL for A2GDM  S:  Comfortable, not feeling ctx.   O:  BP 113/79   Pulse 78   Temp 98 F (36.7 C) (Oral)   Resp 16   Ht 5\' 2"  (1.575 m)   Wt 77.6 kg   LMP 08/18/2017   BMI 31.28 kg/m  EFM: baseline 140 bpm/ mod variability/ + accels/ no decels  Toco: rare SVE: Dilation: 1.5 Effacement (%): Thick Presentation: Vertex Exam by:: Colman Cater   A/P: 38 y.o. G3P2002 [redacted]w[redacted]d  1. Labor: latent 2. FWB: Cat I 3. Pain: analgesia/anesthesia prn 4. GDM: BS stable  Consented for FB placement, placed and tolerated well. Will start Cytotec. Pitocin once FB out. Anticipate SVD.  Julianne Handler, CNM 11:41 AM

## 2018-06-21 NOTE — Discharge Summary (Addendum)
Postpartum Discharge Summary     Patient Name: Stacy [name] DOB: [birth year] MRN: [id number]  Date of admission: 06/21/2018 Delivering Provider: Zettie Cooley E   Date of discharge: 06/23/2018  Admitting diagnosis: pregnancy Intrauterine pregnancy: [redacted]w[redacted]d    Secondary diagnosis:  Active Problems:   Gestational diabetes mellitus (GDM) in third trimester  Additional problems: None     Discharge diagnosis: Term Pregnancy Delivered                                                                                                Post partum procedures:none  Augmentation: AROM, Pitocin, Cytotec and Foley Balloon  Complications: None  Hospital course:  Induction of Labor With Vaginal Delivery   38y.o. yo G3P2002 at 350w1das admitted to the hospital 06/21/2018 for induction of labor.  Indication for induction: A2 DM.  Patient had an uncomplicated labor course as follows: Membrane Rupture Time/Date: 7:15 PM ,06/21/2018   Intrapartum Procedures: Episiotomy: None [1]                                         Lacerations:  Periurethral [8];1st degree [2];Perineal [11]  Patient had delivery of a Viable infant.  Information for the patient's newborn:  LiKissie, Ziolkowski0[[id number]]Delivery Method: Vaginal, Spontaneous(Filed from Delivery Summary)    06/21/2018  Details of delivery can be found in separate delivery note.  Patient had a routine postpartum course. Patient is discharged home 06/23/18.  Magnesium Sulfate recieved: No BMZ received: No  Physical exam  Vitals:   06/22/18 0809 06/22/18 1201 06/22/18 2238 06/23/18 0601  BP: 105/68 117/63 110/67 (!) 95/58  Pulse: 75 75 75 77  Resp: _0 Temp:  98.1 F (36.7 C) 98.3 F (36.8 C) 98.1 F (36.7 C)  TempSrc:  Oral  Oral  SpO2:  99%  98%  Weight:      Height:       General: alert, cooperative and no distress Lochia: appropriate Uterine Fundus: firm Incision: N/A DVT Evaluation: No evidence of DVT seen on  physical exam. No significant calf/ankle edema. Labs: Lab Results  Component Value Date   WBC 10.9 (H) 06/22/2018   HGB 9.5 (L) 06/22/2018   HCT 28.8 (L) 06/22/2018   MCV 82.5 06/22/2018   PLT 196 06/22/2018   No flowsheet data found.  Discharge instruction: per After Visit Summary and "Baby and Me Booklet".  After visit meds:  Allergies as of 06/23/2018   No Known Allergies     Medication List    TAKE these medications   acetaminophen 325 MG tablet Commonly known as: Tylenol Take 2 tablets (650 mg total) by mouth every 4 (four) hours as needed (for pain scale < 4).   Blood Pressure Monitor Kit 1 Device by Does not apply route daily.   glucose blood test strip Use one strip as directed 4 times daily to check blood glucose level.   glyBURIDE 2.5 MG tablet Commonly known as:  DIABETA Take 1 tablet (2.5 mg total) by mouth 2 (two) times daily with a meal.   ibuprofen 600 MG tablet Commonly known as: ADVIL Take 1 tablet (600 mg total) by mouth every 6 (six) hours.   Omega-3-6-9 Caps Take by mouth.   OneTouch Delica Lancets 10X Misc 1 each by Does not apply route 4 (four) times daily.   prenatal vitamin w/FE, FA 27-1 MG Tabs tablet Take 1 tablet by mouth daily at 12 noon.       Diet: carb modified diet  Activity: Advance as tolerated. Pelvic rest for 6 weeks.   Outpatient follow up:4 weeks Follow up Appt: Future Appointments  Date Time Provider [place]  07/26/2018 11:00 AM Sloan Leiter, MD [facility] None   Follow up Visit: Please schedule this patient for Postpartum visit in: 4 weeks with the following provider: Any provider For C/S patients schedule nurse incision check in weeks 2 weeks: no High risk pregnancy complicated by: GDM Delivery mode:  SVD Anticipated Birth Control:  IUD PP Procedures needed: 2 hour GTT  Schedule Integrated BH visit: no  Newborn Data: Live born female  Birth Weight: 3600g APGAR: 9/9  Newborn Delivery   Birth  date/time: 06/21/2018 21:23:00 Delivery type: Vaginal, Spontaneous      Baby Feeding: Breast Disposition: home   06/23/2018 Wilber Oliphant, MD  OB FELLOW DISCHARGE ATTESTATION  I have seen and examined this patient and agree with above documentation in the resident's note.   Phill Myron, D.O. OB Fellow  06/23/2018, 8:06 AM

## 2018-06-21 NOTE — Progress Notes (Addendum)
Labor Progress Note Devlynn Knoff is a 38 y.o. G3P2002 at [redacted]w[redacted]d presented for IOL for GDM  S:  Comfortable with epidural. No c/o.   O:  BP 110/64   Pulse 78   Temp 98.5 F (36.9 C) (Oral)   Resp 16   Ht 5\' 2"  (1.575 m)   Wt 77.6 kg   LMP 08/18/2017   SpO2 99%   BMI 31.28 kg/m  EFM: baseline 140 bpm/ mod variability/ + accels/ no decels  Toco: 2-3 SVE: Dilation: 5 Effacement (%): 50 Station: -2 Presentation: Vertex Exam by:: Ignacia Felling, RN Pitocin: 4 mu/min  A/P: 38 y.o. G3P2002 [redacted]w[redacted]d  1. Labor: latent 2. FWB: Cat I 3. Pain: epidural 4. GDM: BS stable  FB out. Continue Pitocin. Anticipate labor progression and SVD.  Julianne Handler, CNM 2:13 PM

## 2018-06-21 NOTE — Progress Notes (Signed)
Labor Progress Note Wyonia Fontanella is a 38 y.o. G3P2002 at [redacted]w[redacted]d presented for IOL for GDM  S:  Comfortable with epidural, no c/o.   O:  BP 116/68   Pulse 70   Temp 98.1 F (36.7 C) (Oral)   Resp 16   Ht 5\' 2"  (1.575 m)   Wt 77.6 kg   LMP 08/18/2017   SpO2 99%   BMI 31.28 kg/m  EFM: baseline 145 bpm/ mod variability/ + accels/ no decels  Toco: indeterminent SVE: Dilation: 6 Effacement (%): 70 Station: -2 Presentation: Vertex Exam by:: Julianne Handler AROM: clear, large Pitocin: 16 mu/min  A/P: 38 y.o. G3P2002 [redacted]w[redacted]d  1. Labor: early active 2. FWB: Cat I 3. Pain: epidural 4. GDM: stable  AROM for augmentation and IUPC placed. Continue Pitocin titration. Anticipate labor progression and SVD.  Julianne Handler, CNM 7:23 PM

## 2018-06-21 NOTE — Anesthesia Preprocedure Evaluation (Signed)
Anesthesia Evaluation  Patient identified by MRN, date of birth, ID band Patient awake    Reviewed: Allergy & Precautions, NPO status , Patient's Chart, lab work & pertinent test results  Airway Mallampati: II  TM Distance: >3 FB Neck ROM: Full    Dental no notable dental hx.    Pulmonary neg pulmonary ROS, former smoker,    Pulmonary exam normal breath sounds clear to auscultation       Cardiovascular negative cardio ROS Normal cardiovascular exam Rhythm:Regular Rate:Normal     Neuro/Psych negative neurological ROS  negative psych ROS   GI/Hepatic negative GI ROS, Neg liver ROS,   Endo/Other  negative endocrine ROSdiabetes, Gestational, Oral Hypoglycemic Agents  Renal/GU negative Renal ROS  negative genitourinary   Musculoskeletal negative musculoskeletal ROS (+)   Abdominal   Peds  Hematology negative hematology ROS (+)   Anesthesia Other Findings   Reproductive/Obstetrics (+) Pregnancy                             Anesthesia Physical Anesthesia Plan  ASA: III  Anesthesia Plan: Epidural   Post-op Pain Management:    Induction:   PONV Risk Score and Plan: Treatment may vary due to age or medical condition  Airway Management Planned: Natural Airway  Additional Equipment:   Intra-op Plan:   Post-operative Plan:   Informed Consent: I have reviewed the patients History and Physical, chart, labs and discussed the procedure including the risks, benefits and alternatives for the proposed anesthesia with the patient or authorized representative who has indicated his/her understanding and acceptance.       Plan Discussed with: Anesthesiologist  Anesthesia Plan Comments: (Patient identified. Risks, benefits, options discussed with patient including but not limited to bleeding, infection, nerve damage, paralysis, failed block, incomplete pain control, headache, blood pressure  changes, nausea, vomiting, reactions to medication, itching, and post partum back pain. Confirmed with bedside nurse the patient's most recent platelet count. Confirmed with the patient that they are not taking any anticoagulation, have any bleeding history or any family history of bleeding disorders. Patient expressed understanding and wishes to proceed. All questions were answered. )        Anesthesia Quick Evaluation

## 2018-06-21 NOTE — Anesthesia Procedure Notes (Signed)
Epidural Patient location during procedure: OB Start time: 06/21/2018 1:10 PM End time: 06/21/2018 1:25 PM  Staffing Anesthesiologist: Freddrick March, MD Performed: anesthesiologist   Preanesthetic Checklist Completed: patient identified, pre-op evaluation, timeout performed, IV checked, risks and benefits discussed and monitors and equipment checked  Epidural Patient position: sitting Prep: site prepped and draped and DuraPrep Patient monitoring: continuous pulse ox, blood pressure, heart rate and cardiac monitor Approach: midline Location: L3-L4 Injection technique: LOR air  Needle:  Needle type: Tuohy  Needle gauge: 17 G Needle length: 9 cm Needle insertion depth: 5 cm Catheter type: closed end flexible Catheter size: 19 Gauge Catheter at skin depth: 10 cm Test dose: negative  Assessment Sensory level: T8 Events: blood not aspirated, injection not painful, no injection resistance, negative IV test and no paresthesia  Additional Notes Patient identified. Risks/Benefits/Options discussed with patient including but not limited to bleeding, infection, nerve damage, paralysis, failed block, incomplete pain control, headache, blood pressure changes, nausea, vomiting, reactions to medication both or allergic, itching and postpartum back pain. Confirmed with bedside nurse the patient's most recent platelet count. Confirmed with patient that they are not currently taking any anticoagulation, have any bleeding history or any family history of bleeding disorders. Patient expressed understanding and wished to proceed. All questions were answered. Sterile technique was used throughout the entire procedure. Please see nursing notes for vital signs. Test dose was given through epidural catheter and negative prior to continuing to dose epidural or start infusion. Warning signs of high block given to the patient including shortness of breath, tingling/numbness in hands, complete motor block,  or any concerning symptoms with instructions to call for help. Patient was given instructions on fall risk and not to get out of bed. All questions and concerns addressed with instructions to call with any issues or inadequate analgesia.  Reason for block:procedure for pain

## 2018-06-22 LAB — ABO/RH: ABO/RH(D): AB POS

## 2018-06-22 LAB — CBC
HCT: 28.8 % — ABNORMAL LOW (ref 36.0–46.0)
Hemoglobin: 9.5 g/dL — ABNORMAL LOW (ref 12.0–15.0)
MCH: 27.2 pg (ref 26.0–34.0)
MCHC: 33 g/dL (ref 30.0–36.0)
MCV: 82.5 fL (ref 80.0–100.0)
Platelets: 196 10*3/uL (ref 150–400)
RBC: 3.49 MIL/uL — ABNORMAL LOW (ref 3.87–5.11)
RDW: 14.6 % (ref 11.5–15.5)
WBC: 10.9 10*3/uL — ABNORMAL HIGH (ref 4.0–10.5)
nRBC: 0 % (ref 0.0–0.2)

## 2018-06-22 NOTE — Anesthesia Postprocedure Evaluation (Signed)
Anesthesia Post Note  Patient: Stacy [name]  Procedure(s) Performed: AN AD [place]     Patient location during evaluation: Mother Baby Anesthesia Type: Epidural Level of consciousness: awake and alert and oriented Pain management: satisfactory to patient Vital Signs Assessment: post-procedure vital signs reviewed and stable Respiratory status: spontaneous breathing and nonlabored ventilation Cardiovascular status: stable Postop Assessment: no headache, no backache, no signs of nausea or vomiting, adequate PO intake, patient able to bend at knees and able to ambulate (patient up walking) Anesthetic complications: no    Last Vitals:  Vitals:   06/22/18 0404 06/22/18 0809  BP: (!) 90/59 105/68  Pulse: 70 75  Resp:  18  Temp: 36.9 C   SpO2:      Last Pain:  Vitals:   06/22/18 0720  TempSrc:   PainSc: 0-No pain   Pain Goal:                   Willa Rough

## 2018-06-22 NOTE — Progress Notes (Signed)
Post Partum Day 1 Subjective: no complaints, up ad lib, voiding and tolerating PO, small lochia, plans to breastfeed, IUD  Objective: Blood pressure (!) 90/59, pulse 70, temperature 98.4 F (36.9 C), temperature source Oral, resp. rate 18, height 5\' 2"  (1.575 m), weight 77.6 kg, last menstrual period 08/18/2017, SpO2 99 %, unknown if currently breastfeeding.  Physical Exam:  General: alert, cooperative and no distress Lochia:normal flow Chest: CTAB Heart: RRR no m/r/g Abdomen: +BS, soft, nontender,  Uterine Fundus: firm DVT Evaluation: No evidence of DVT seen on physical exam. Extremities: no edema  Recent Labs    06/21/18 0821 06/22/18 0603  HGB 11.0* 9.5*  HCT 33.5* 28.8*    Assessment/Plan: Plan for discharge tomorrow   LOS: 1 day   Stacy [name] 06/22/2018, 8:06 AM

## 2018-06-22 NOTE — Lactation Note (Signed)
This note was copied from a baby's chart. Lactation Consultation Note  Patient Name: Stacy [name] [id number] Date: 06/22/2018 Reason for consult: Initial assessment;Term;Maternal endocrine disorder Type of Endocrine Disorder?: Diabetes(GDM)  10 hours old FT female who is being exclusively BF by his mother, she's a P3 and experienced BF. She was able to BF her other two children for 12 months but she's not familiar yet with hand expression. LC revised hand expression with mom, her nipples are very sensitive, but no signs of trauma, they're intact. Mom able to get colostrum easily, LC rubbed it in baby's mouth, he was asleep in his bassinet. Mom has a [place] DEBP at home, [facility] also advised her against lanolin and asked her to request coconut oil instead in case she wants something else for breast care in addition to her colostrum. Reviewed prevention and treatment for sore nipples.  Offered assistance with latch but mom politely declined, she told LC baby already had a feeding. Per mom BF is going well and she's surprised she's actually latching at every feeding. Asked mom to call for assistance when needed. Baby's last 2 glucose were 40 and 42, advised mom to start giving baby extra drops of colostrum and offered to set up a DEBP pump but mom feels more comfortable using a hand pump. Instructions, cleaning and storage were reviewed. Discussed pacifier use (baby had one in his crib), feeding cues, normal newborn behavior and cluster feeding.  Feeding plan:  1. Encouraged mom to feed baby STS 8-12 times/24 hours or sooner if feeding cues are present 2. Hand expression/pumping and spoon feeding were also encouraged  BF brochure (SP), BF resources (SP) and feeding diary were reviewed. Parents reported all questions and concerns were answered, they're both aware of [place] OP services and will call PRN.  Maternal Data Formula Feeding for Exclusion: No Has patient been taught Hand Expression?:  Yes Does the patient have breastfeeding experience prior to this delivery?: Yes  Feeding Feeding Type: Breast Fed   Interventions Interventions: Breast feeding basics reviewed;Hand pump;Breast massage;Hand express;Breast compression  Lactation Tools Discussed/Used Tools: Pump Breast pump type: Manual WIC Program: No Pump Review: Setup, frequency, and cleaning Initiated by:: MPeck Date initiated:: 06/22/18   Consult Status Consult Status: Follow-up Date: 06/23/18 Follow-up type: In-patient    Stacy [name] Stacy [name] 06/22/2018, 1:53 PM

## 2018-06-22 NOTE — Plan of Care (Signed)
  Problem: Education: Goal: Knowledge of General Education information will improve Description: Including pain rating scale, medication(s)/side effects and non-pharmacologic comfort measures Outcome: Completed/Met   Problem: Clinical Measurements: Goal: Ability to maintain clinical measurements within normal limits will improve Outcome: Completed/Met   Problem: Clinical Measurements: Goal: Will remain free from infection Outcome: Completed/Met   Problem: Clinical Measurements: Goal: Diagnostic test results will improve Outcome: Completed/Met

## 2018-06-23 MED ORDER — IBUPROFEN 600 MG PO TABS: 600.0000 mg | ORAL_TABLET | Freq: Four times a day (QID) | ORAL | 0 refills | Status: DC

## 2018-06-23 MED ORDER — ACETAMINOPHEN 325 MG PO TABS: 650.0000 mg | ORAL_TABLET | ORAL | Status: DC | PRN

## 2018-06-23 NOTE — Discharge Instructions (Signed)
Home Care Instructions for Mom Activity  Gradually return to your regular activities.  Let yourself rest. Nap while your baby sleeps.  Avoid lifting anything that is heavier than 10 lb (4.5 kg) until your health care provider says it is okay.  Avoid activities that take a lot of effort and energy (are strenuous) until approved by your health care provider. Walking at a slow-to-moderate pace is usually safe.  If you had a cesarean delivery: ? Do not vacuum, climb stairs, or drive a car for 4-6 weeks. ? Have someone help you at home until you feel like you can do your usual activities yourself. ? Do exercises as told by your health care provider, if this applies. Vaginal bleeding You may continue to bleed for 4-6 weeks after delivery. Over time, the amount of blood usually decreases and the color of the blood usually gets lighter. However, the flow of bright red blood may increase if you have been too active. If you need to use more than one pad in an hour because your pad gets soaked, or if you pass a large clot:  Lie down.  Raise your feet.  Place a cold compress on your lower abdomen.  Rest.  Call your health care provider. If you are breastfeeding, your period should return anytime between 8 weeks after delivery and the time that you stop breastfeeding. If you are not breastfeeding, your period should return 6-8 weeks after delivery. Perineal care  The perineal area, or perineum, is the part of your body between your thighs. After delivery, this area needs special care. Follow these instructions as told by your health care provider.  Take warm tub baths for 15-20 minutes.  Use medicated pads and pain-relieving sprays and creams as told.  Do not use tampons or douches until vaginal bleeding has stopped.  Each time you go to the bathroom: ? Use a peri bottle. ? Change your pad. ? Use towelettes in place of toilet paper until your stitches have healed.  Do Kegel exercises  every day. Kegel exercises help to maintain the muscles that support the vagina, bladder, and bowels. You can do these exercises while you are standing, sitting, or lying down. To do Kegel exercises: ? Tighten the muscles of your abdomen and the muscles that surround your birth canal. ? Hold for a few seconds. ? Relax. ? Repeat until you have done this 5 times in a row.  To prevent hemorrhoids from developing or getting worse: ? Drink enough fluid to keep your urine clear or pale yellow. ? Avoid straining when having a bowel movement. ? Take over-the-counter medicines and stool softeners as told by your health care provider. Breast care  Wear a tight-fitting bra.  Avoid taking over-the-counter pain medicine for breast discomfort.  Apply ice to the breasts to help with discomfort as needed: ? Put ice in a plastic bag. ? Place a towel between your skin and the bag. ? Leave the ice on for 20 minutes or as told by your health care provider. Nutrition  Eat a well-balanced diet.  Do not try to lose weight quickly by cutting back on calories.  Take your prenatal vitamins until your postpartum checkup or until your health care provider tells you to stop. Postpartum depression You may find yourself crying for no apparent reason and unable to cope with all of the changes that come with having a newborn. This mood is called postpartum depression. Postpartum depression happens because your hormone levels change after delivery.  If you have postpartum depression, get support from your partner, friends, and family. If the depression does not go away on its own after several weeks, contact your health care provider. Breast self-exam   Do a breast self-exam each month, at the same time of the month. If you are breastfeeding, check your breasts just after a feeding, when your breasts are less full. If you are breastfeeding and your period has started, check your breasts on day 5, 6, or 7 of your  period. Report any lumps, bumps, or discharge to your health care provider. Know that breasts are normally lumpy if you are breastfeeding. This is temporary, and it is not a health risk. Intimacy and sexuality Avoid sexual activity for at least 3-4 weeks after delivery or until the brownish-red vaginal flow is completely gone. If you want to avoid pregnancy, use some form of birth control. You can get pregnant after delivery, even if you have not had your period. Contact a health care provider if:  You feel unable to cope with the changes that a child brings to your life, and these feelings do not go away after several weeks.  You notice a lump, a bump, or discharge on your breast. Get help right away if:  Blood soaks your pad in 1 hour or less.  You have: ? Severe pain or cramping in your lower abdomen. ? A bad-smelling vaginal discharge. ? A fever that is not controlled by medicine. ? A fever, and an area of your breast is red and sore. ? Pain or redness in your calf. ? Sudden, severe chest pain. ? Shortness of breath. ? Painful or bloody urination. ? Problems with your vision. ? You vomit for 12 hours or longer. ? You develop a severe headache. ? You have serious thoughts about hurting yourself, your child, or anyone else. This information is not intended to replace advice given to you by your health care provider. Make sure you discuss any questions you have with your health care provider. Document Released: 12/25/1999 Document Revised: 02/22/2017 Document Reviewed: 06/30/2014 Elsevier Interactive Patient Education  2019 [employer] Inc[employer]  Care of a Perineal Tear  A perineal tear is a cut or tear (laceration) in the tissue between the opening of the vagina and the anus (perineum). Some women develop a perineal tear during a vaginal birth. This can happen as the baby emerges from the birth canal and the perineum is stretched. There are four degrees of perineal tears based on how deep  and long the laceration is:  First degree. This involves a shallow tear at the edge of the vaginal opening that extends slightly into the perineal skin.  Second degree. This involves tearing described in first degree perineal tear, and an additional deeper tear of the vaginal opening and perineal tissues. It may also include tearing of a muscle just under the perineal skin.  Third degree. This involves tearing described in first and second degree perineal tears, with the addition that tearing in the third degree extends into the muscle of the anus (anal sphincter).  Fourth degree. This involves all levels of tears described in first, second, and third degree perineal tears, with the tear in the fourth degree extending into the rectum. First and second degree perineal tears may or may not be stitched closed, depending on their location and appearance. Third and fourth degree perineal tears are stitched closed immediately after the babys birth. What are the risks? Depending on the type of perineal tear you  have, you may be at risk for:  Bleeding.  Developing a collection of blood in the perineal tear area (hematoma).  Pain. This may include pain when you urinate, or pain when you have a bowel movement.  Infection at the site of the tear.  Fever.  Trouble controlling your urination or bowels (incontinence).  Painful sex. How to care for a perineal tear Wound care  Take a sitz bath as told by your health care provider. A sitz bath is a warm water bath that is taken while you are sitting down. The water should only come up to your hips and should cover your buttocks. This can speed up healing. ? Partially fill a bathtub with warm water. You will only need the water to be deep enough to cover your hips and buttocks when you are sitting in it. ? If your health care provider told you to put medicine in the water, follow the directions exactly as told. ? Sit in the water and open the tub  drain a little. ? Turn on the warm water again to keep the tub at the correct level. Keep the water running constantly. ? Soak in the water for 15-20 minutes or as told by your health care provider. ? After the sitz bath, pat the affected area dry first. Do not rub it. ? Be careful when you stand up after the sitz bath because you may feel dizzy.  Wash your hands before and after applying medicine to the area.  Wear a sanitary pad as told by your health care provider. Change the pad as often as told by your health care provider.  Leave stitches (sutures), skin glue, or adhesive strips in place. These skin closures may need to stay in place for 2 weeks or longer. If adhesive strip edges start to loosen and curl up, you may trim the loose edges. Do not remove adhesive strips completely unless your health care provider tells you to do that.  Check your wound every day for signs of infection. Check for: ? Redness, swelling, or pain. ? Fluid or blood. ? Warmth. ? Pus or a bad smell. Managing pain  If directed, put ice on the painful area: ? Put ice in a plastic bag. ? Place a towel between your skin and the bag. ? Leave the ice on for 20 minutes, 2-3 times a day.  Apply a numbing spray to the perineal tear site as told by your health care provider. This may help with discomfort.  Take and apply over-the-counter and prescription medicines only as told by your health care provider.  If told, put about 3 witch hazel-containing hemorrhoid treatment pads on top of your sanitary pad. The witch hazel in the hemorrhoid pads helps with swelling and discomfort.  Sit on an inflatable ring or pillow. This may provide comfort. General instructions  Squeeze warm water on your perineum after urinating. This should be done from front to back with a squeeze bottle. Pat the area to dry it.  Do not have sex, use tampons, or place anything in your vagina for at least 6 weeks or as told by your health care  provider.  Keep all follow-up visits as told by your health care provider. These include any postpartum visits. This is important. Contact a health care provider if:  Your pain is not relieved with medicines.  You have painful urination.  You have redness, swelling, or pain around your tear.  You have fluid or blood coming from your  tear.  Your tear feels warm to the touch.  You have pus or a bad smell coming from your tear.  You have a fever. Get help right away if:  Your tear opens.  You cannot urinate.  You have an increase in bleeding.  You have severe pain. Summary  A perineal tear is a cut or tear (laceration) in the tissue between the opening of the vagina and the anus (perineum).  There are four degrees of perineal tears based on how deep and long the laceration is.  First and second-degree perineal tears may or may not be stitched closed, depending on their location and appearance. Third and fourth- degree perineal tears are stitched closed immediately after the babys birth.  Follow your health care provider's instructions for caring for your perineal tear. Know how to manage pain and how to care for your wound. Know when to call your health care provider and when to seek immediate emergency care. This information is not intended to replace advice given to you by your health care provider. Make sure you discuss any questions you have with your health care provider. Document Released: 05/13/2013 Document Revised: 02/01/2016 Document Reviewed: 02/01/2016 Elsevier Interactive Patient Education  2019 [employer]

## 2018-06-23 NOTE — Lactation Note (Signed)
This note was copied from a baby's chart. Lactation Consultation Note  Patient Name: Stacy [name] Date: 06/23/2018   P3, Baby 71 hours old.  Mother denies questions or concerns. Mother latched baby upon entering although he was sleepy and recently was fed. Reviewed waking techniques. Feed on demand approximately 8-12 times per day.   Reviewed engorgement care and monitoring voids/stools.      Maternal Data    Feeding Feeding Type: Breast Fed  LATCH Score                   Interventions    Lactation Tools Discussed/Used     Consult Status      Stacy [name] 06/23/2018, 9:13 AM

## 2018-07-26 ENCOUNTER — Encounter: Payer: Self-pay | Admitting: Obstetrics and Gynecology

## 2018-07-26 ENCOUNTER — Other Ambulatory Visit: Payer: Self-pay

## 2018-07-26 ENCOUNTER — Ambulatory Visit (INDEPENDENT_AMBULATORY_CARE_PROVIDER_SITE_OTHER): Payer: BC Managed Care – PPO | Admitting: Obstetrics and Gynecology

## 2018-07-26 DIAGNOSIS — O24415 Gestational diabetes mellitus in pregnancy, controlled by oral hypoglycemic drugs: Secondary | ICD-10-CM

## 2018-07-26 NOTE — Progress Notes (Signed)
Obstetrics/Postpartum Visit  Appointment Date: 07/26/2018  OBGYN Clinic: [facility]  Primary Care Provider: Bartholome Bill  Chief Complaint:  Chief Complaint  Patient presents with  . Postpartum Care  . Contraception    IUD Insertion     History of Present Illness: Stacy [name] is a 38 y.o. Hispanic [phone] (Patient's last menstrual period was 08/18/2017.), seen for the above chief complaint. Her past medical history is significant for gDMA2.   She is s/p SVD on 06/21/18 at 39 weeks; she was discharged to home on PPD#2. Pregnancy complicated by GDMA2.  Complains of some abdominal soreness and headache, thinks may be related to not sleeping enough, but overall feeling well.  Vaginal bleeding or discharge: Yes  Breast or formula feeding: breast Intercourse: No  Contraception: condoms/vasectomy PP depression s/s: No  Any bowel or bladder issues: No  Pap smear: no abnormalities (date: 11/2017 in media)  Review of Systems: Positive for n/a.   Her 12 point review of systems is negative or as noted in the History of Present Illness.  Patient Active Problem List   Diagnosis Date Noted  . Gestational diabetes mellitus (GDM) in third trimester 06/21/2018  . Gestational diabetes mellitus (GDM) controlled on oral hypoglycemic drug, antepartum 01/11/2018  . Supervision of high risk pregnancy, antepartum 11/02/2017  . AMA (advanced maternal age) multigravida 35+ 11/02/2017    Medications [place] had no medications administered during this visit. Current Outpatient Medications  Medication Sig Dispense Refill  . acetaminophen (TYLENOL) 325 MG tablet Take 2 tablets (650 mg total) by mouth every 4 (four) hours as needed (for pain scale < 4).    . Blood Pressure Monitor KIT 1 Device by Does not apply route daily. 1 each 0  . glucose blood test strip Use one strip as directed 4 times daily to check blood glucose level. 100 each 6  . glyBURIDE (DIABETA) 2.5 MG  tablet Take 1 tablet (2.5 mg total) by mouth 2 (two) times daily with a meal. 60 tablet 3  . ibuprofen (ADVIL) 600 MG tablet Take 1 tablet (600 mg total) by mouth every 6 (six) hours. 30 tablet 0  . Omega-3-6-9 CAPS Take by mouth.    Glory Rosebush DELICA LANCETS 60R MISC 1 each by Does not apply route 4 (four) times daily. 100 each 6  . prenatal vitamin w/FE, FA (PRENATAL 1 + 1) 27-1 MG TABS tablet Take 1 tablet by mouth daily at 12 noon. 30 each 0   No current facility-administered medications for this visit.     Allergies Patient has no known allergies.  Physical Exam:  LMP 08/18/2017  There is no height or weight on file to calculate BMI. General appearance: Well nourished, well developed female in no acute distress.  Cardiovascular: regular rate and rhythm Respiratory:   Normal respiratory effort Abdomen: soft, no masses, hernias; diffusely non tender to palpation, non distended Breasts: not examined. Neuro/Psych:  Normal mood and affect.  Skin:  Warm and dry.   PP Depression Screening:   Edinburgh Postnatal Depression Scale - 07/26/18 1110      Edinburgh Postnatal Depression Scale:  In the Past 7 Days   I have been able to laugh and see the funny side of things.  0    I have looked forward with enjoyment to things.  0    I have blamed myself unnecessarily when things went wrong.  0    I have been anxious or worried for no good reason.  0    I have felt scared or panicky for no good reason.  0    Things have been getting on top of me.  0    I have been so unhappy that I have had difficulty sleeping.  0    I have felt sad or miserable.  0    I have been so unhappy that I have been crying.  0    The thought of harming myself has occurred to me.  0    Edinburgh Postnatal Depression Scale Total  0      Assessment: Patient is a 38 y.o. [id number] who is 4 weeks post partum from a SVD. She is doing very well, no issues.  Plan:   1. Postpartum state Pap UTD Doing well, no  issues will use vasectomy/condoms  2. Gestational diabetes mellitus (GDM) controlled on oral hypoglycemic drug, antepartum Return for 2 hr GTT   RTC 1 yr for annual   K. Arvilla Meres, M.D. Attending [facility] for [employer])

## 2018-08-16 ENCOUNTER — Other Ambulatory Visit: Payer: BC Managed Care – PPO

## 2018-08-31 ENCOUNTER — Other Ambulatory Visit: Payer: BC Managed Care – PPO

## 2018-08-31 ENCOUNTER — Other Ambulatory Visit: Payer: Self-pay

## 2018-09-01 LAB — GLUCOSE TOLERANCE, 2 HOURS
Glucose, 2 hour: 113 mg/dL (ref 65–139)
Glucose, GTT - Fasting: 86 mg/dL (ref 65–99)

## 2018-10-22 ENCOUNTER — Encounter: Payer: BC Managed Care – PPO | Admitting: Obstetrics and Gynecology

## 2019-03-11 ENCOUNTER — Ambulatory Visit: Payer: BC Managed Care – PPO | Admitting: Obstetrics and Gynecology

## 2019-03-11 ENCOUNTER — Encounter: Payer: Self-pay | Admitting: Obstetrics and Gynecology

## 2019-03-11 ENCOUNTER — Other Ambulatory Visit: Payer: Self-pay

## 2019-03-11 VITALS — BP 109/73 | HR 93 | Wt 158.0 lb

## 2019-03-11 DIAGNOSIS — Z124 Encounter for screening for malignant neoplasm of cervix: Secondary | ICD-10-CM | POA: Diagnosis not present

## 2019-03-11 DIAGNOSIS — Z113 Encounter for screening for infections with a predominantly sexual mode of transmission: Secondary | ICD-10-CM

## 2019-03-11 DIAGNOSIS — N898 Other specified noninflammatory disorders of vagina: Secondary | ICD-10-CM

## 2019-03-11 DIAGNOSIS — Z1151 Encounter for screening for human papillomavirus (HPV): Secondary | ICD-10-CM | POA: Diagnosis not present

## 2019-03-11 DIAGNOSIS — N760 Acute vaginitis: Secondary | ICD-10-CM | POA: Diagnosis not present

## 2019-03-11 MED ORDER — VALACYCLOVIR HCL 1 G PO TABS: 1000.0000 mg | ORAL_TABLET | Freq: Two times a day (BID) | ORAL | 3 refills | Status: DC

## 2019-03-11 NOTE — Progress Notes (Signed)
Pt is in the office for GYN visit, complains of painful blisters around labia and inside of vagina, pt reports symptoms started on Friday with increased discharge. Currently breastfeeding

## 2019-03-11 NOTE — Progress Notes (Signed)
39 yo P3 here for the evaluation of vaginitis and painful genital lesions. Patient reports onset of labial pain on 03/08/19. The following day she noticed the presence of blisters on her labia. She also describes a vaginal discharge with occasional pruritis. Burning with urination started today as the urine touches her skin. Patient is still breastfeeding. Patient is without any other complaints  Past Medical History:  Diagnosis Date  . Gestational diabetes    glyburide   Past Surgical History:  Procedure Laterality Date  . MULTIPLE TOOTH EXTRACTIONS Bilateral    for orthodontic care   Family History  Problem Relation Age of Onset  . Diabetes Mother   . Hyperlipidemia Mother   . Diabetes Father   . Cancer Neg Hx    Social History   Tobacco Use  . Smoking status: Former Smoker    Quit date: 06/28/2001    Years since quitting: 17.7  . Smokeless tobacco: Never Used  Substance Use Topics  . Alcohol use: Not Currently    Alcohol/week: 0.0 standard drinks    Comment: Ocassionally  . Drug use: No   ROS See pertinent in HPI. All other systems reviewed and negative  Blood pressure 109/73, pulse 93, weight 158 lb (71.7 kg), currently breastfeeding. GENERAL: Well-developed, well-nourished female in no acute distress.  ABDOMEN: Soft, nontender, nondistended. No organomegaly. PELVIC: Normal external female genitalia with bilateral labial lesions some ulcerated. Vagina is pink and rugated.  Normal discharge. Normal appearing cervix. Uterus is normal in size.  No adnexal mass or tenderness. EXTREMITIES: No cyanosis, clubbing, or edema, 2+ distal pulses.  A/P 39 yo P3 with vaginitis and high suspicion for genital herpes - HSV cultures collected - Vaginal swab collected - Pap smear collected - Rx valtrex provided for treatment of suspected HSV - Patient will be contacted with abnormal results

## 2019-03-12 LAB — CERVICOVAGINAL ANCILLARY ONLY
Bacterial Vaginitis (gardnerella): NEGATIVE
Candida Glabrata: NEGATIVE
Candida Vaginitis: NEGATIVE
Chlamydia: NEGATIVE
Comment: NEGATIVE
Comment: NEGATIVE
Comment: NEGATIVE
Comment: NEGATIVE
Comment: NEGATIVE
Comment: NORMAL
Neisseria Gonorrhea: NEGATIVE
Trichomonas: NEGATIVE

## 2019-03-13 LAB — CYTOLOGY - PAP
Comment: NEGATIVE
Diagnosis: NEGATIVE
High risk HPV: NEGATIVE

## 2019-03-14 ENCOUNTER — Encounter: Payer: Self-pay | Admitting: Obstetrics and Gynecology

## 2019-03-14 DIAGNOSIS — A6000 Herpesviral infection of urogenital system, unspecified: Secondary | ICD-10-CM | POA: Insufficient documentation

## 2019-03-14 LAB — HERPES SIMPLEX VIRUS CULTURE

## 2019-11-07 IMAGING — US US MFM FETAL BPP WO NON STRESS
1 series · 14 of 28 positions shown · non-contrast
Comparison: none

[Series 1: us mfm fetal bpp wo non stress · 58 acquisitions, 14 frames shown]
[im 3/58]
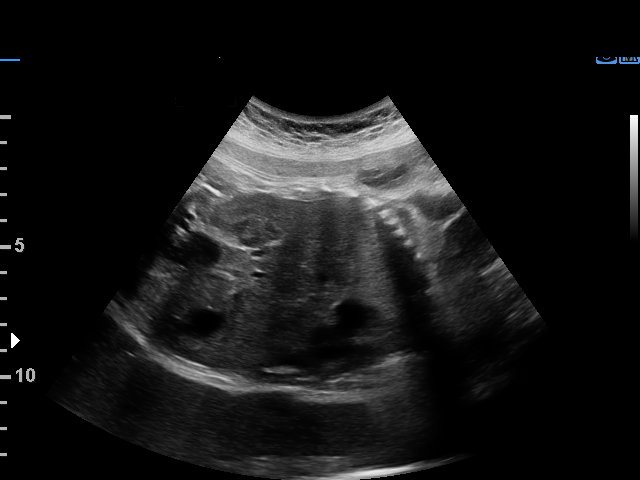
[im 7/58]
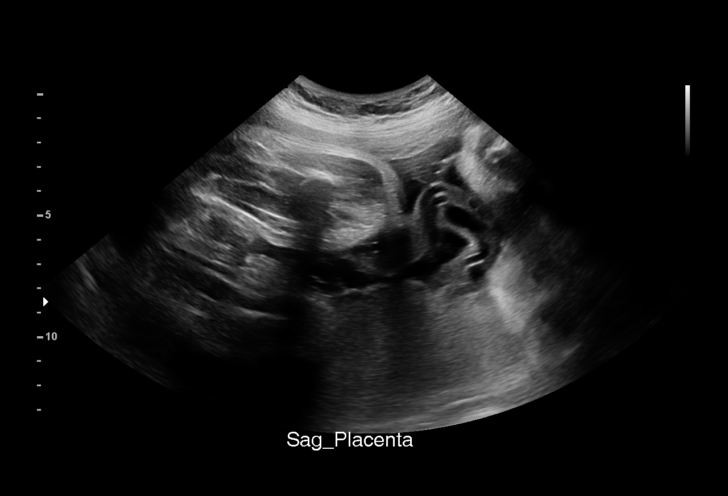
[im 11/58]
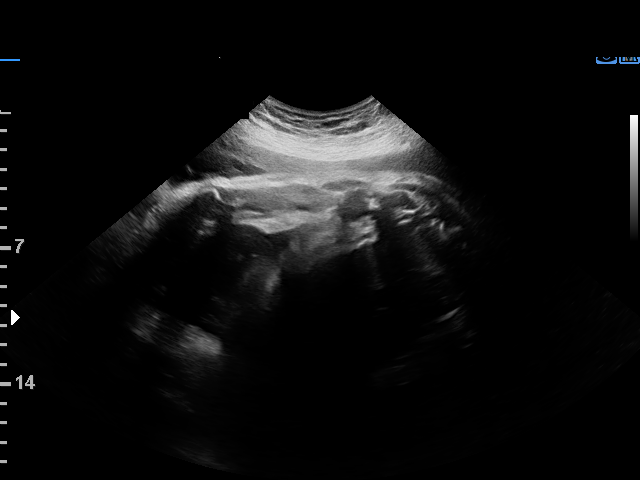
[im 15/58]
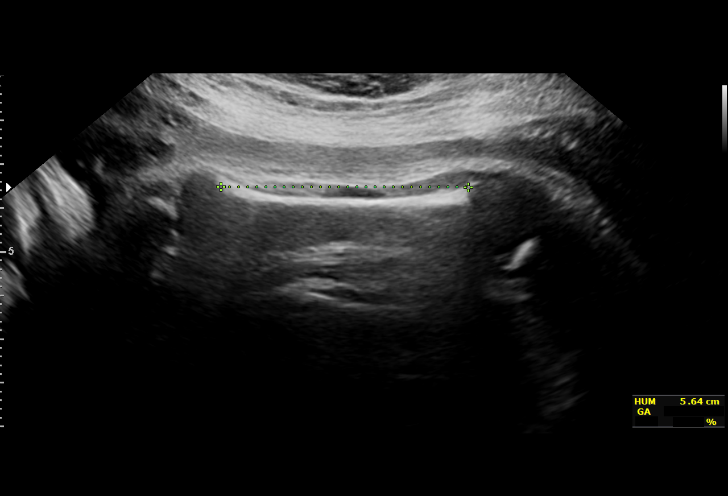
[im 20/58]
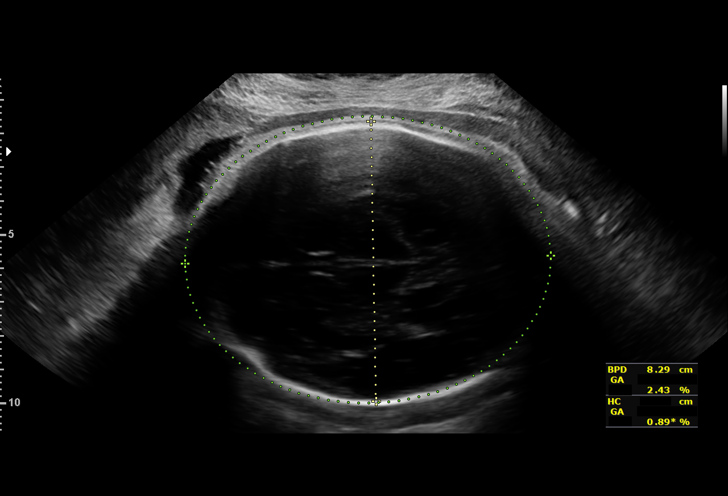
[im 24/58]
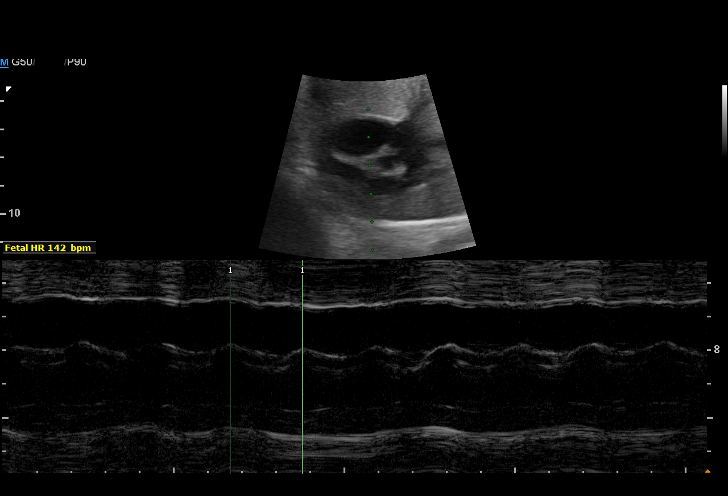
[im 28/58]
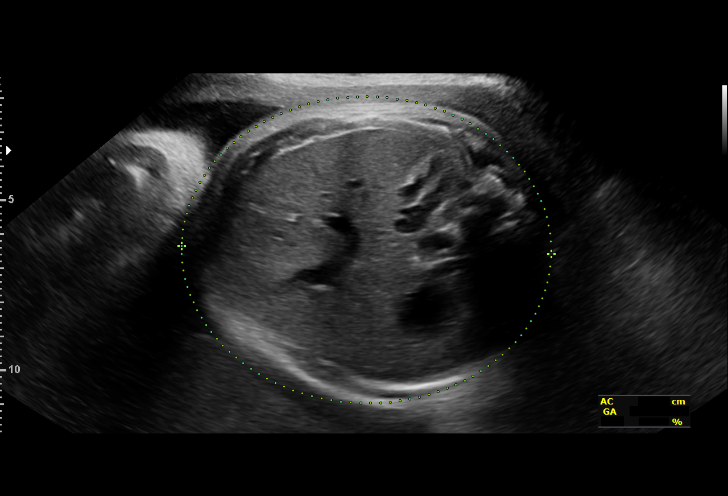
[im 32/58]
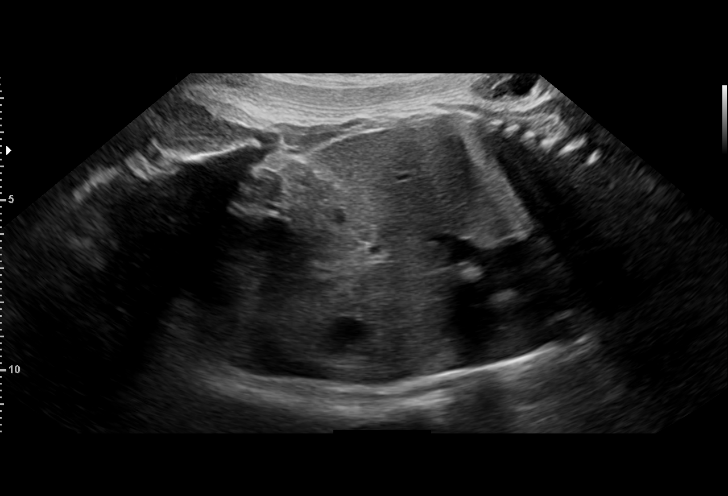
[im 36/58]
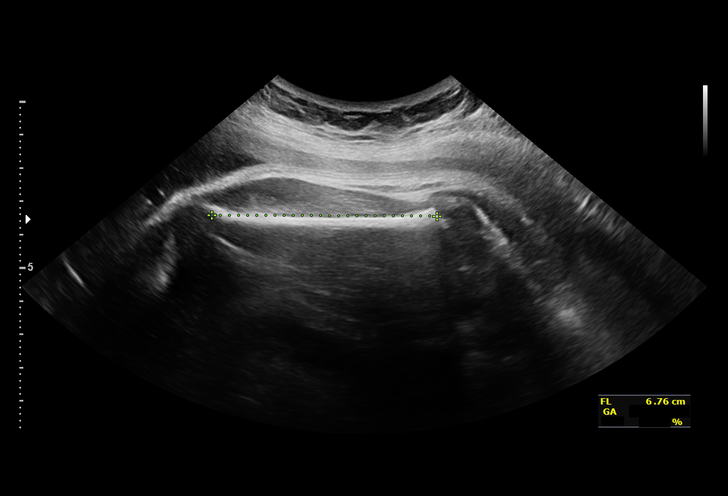
[im 41/58]
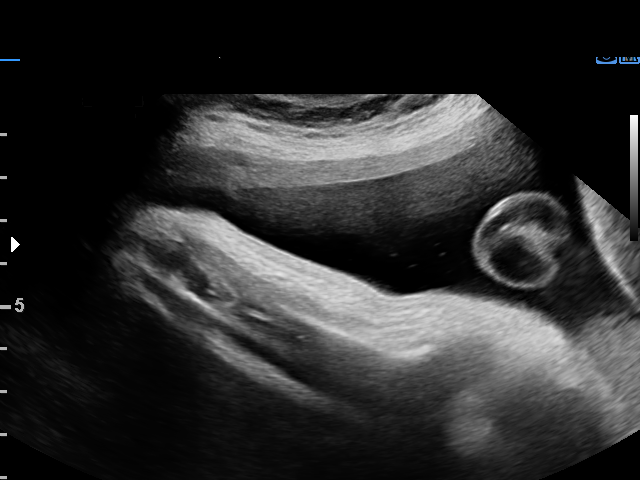
[im 45/58]
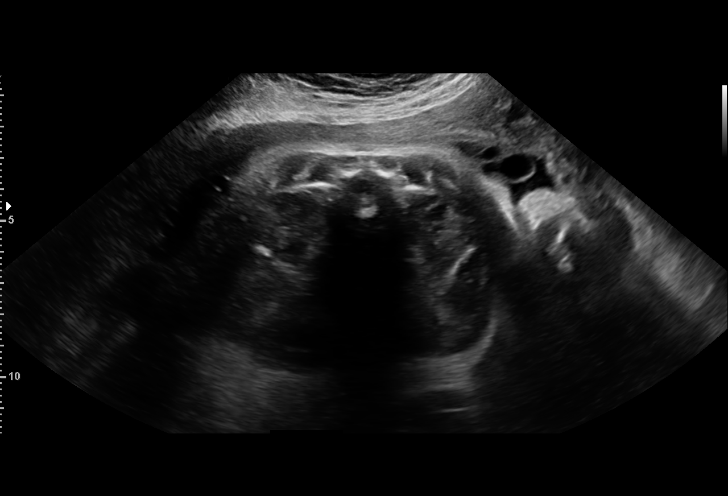
[im 49/58]
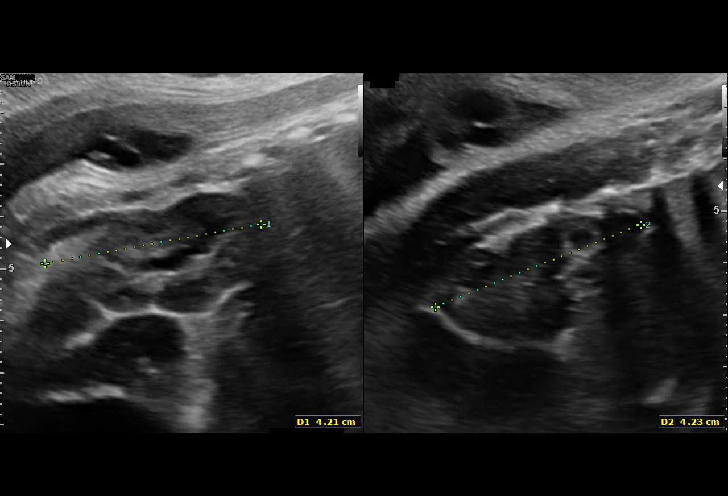
[im 53/58]
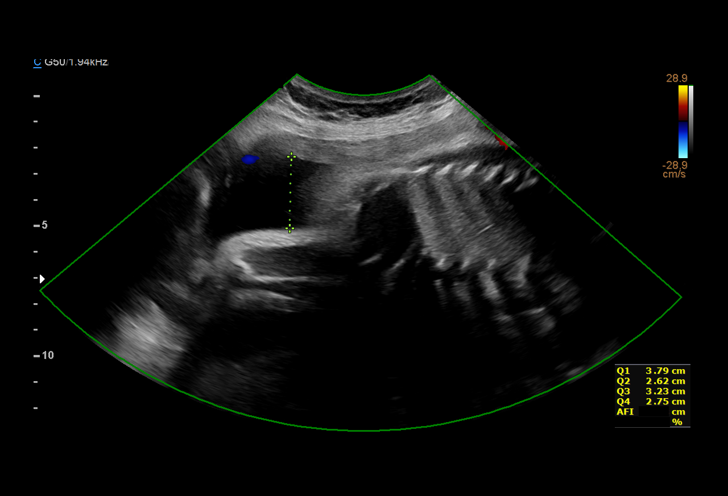
[im 58/58]
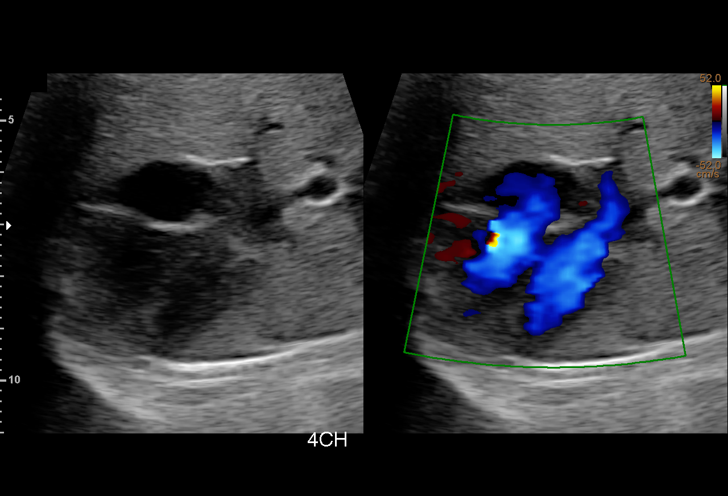

[14 of 28 positions shown; findings below may reference images not displayed]

MUN MUN
     STRESS                                            MUN MUN
 ----------------------------------------------------------------------

 ----------------------------------------------------------------------
Indications

  Encounter for other antenatal screening
  follow-up
  Advanced maternal age multigravida 35+,
  third trimester(Low Risk NIPS, Negative AFP)
  Gestational diabetes in pregnancy,
  controlled by oral hypoglycemic drugs
  (glyburide)
  36 weeks gestation of pregnancy

 ----------------------------------------------------------------------
Vital Signs

 BMI:
Fetal Evaluation

 Num Of Fetuses:         1
 Fetal Heart Rate(bpm):  142
 Cardiac Activity:       Observed
 Presentation:           Cephalic
 Placenta:               Posterior
 P. Cord Insertion:      Previously Visualized

 Amniotic Fluid
 AFI FV:      Within normal limits
 AFI Sum(cm)     %Tile       Largest Pocket(cm)
 12.39           40

 RUQ(cm)       RLQ(cm)       LUQ(cm)        LLQ(cm)

Biophysical Evaluation

 Amniotic F.V:   Within normal limits       F. Tone:        Observed
 F. Movement:    Observed                   Score:          [DATE]
 F. Breathing:   Observed
Biometry

 BPD:      82.9  mm     G. Age:  33w 3d          2  %    CI:         74.1   %    70 - 86
                                                         FL/HC:      22.2   %    20.1 -
 HC:      305.8  mm     G. Age:  34w 0d        < 3  %    HC/AC:      0.95        0.93 -
 AC:      321.1  mm     G. Age:  36w 0d         54  %    FL/BPD:     82.0   %    71 - 87
 FL:         68  mm     G. Age:  34w 6d         17  %    FL/AC:      21.2   %    20 - 24
 HUM:      56.6  mm     G. Age:  32w 6d          5  %
 LV:        2.5  mm

 Est. FW:    6365  gm    5 lb 13 oz      40  %
OB History

 Gravidity:    3         Term:   2        Prem:   0        SAB:   0
 TOP:          0       Ectopic:  0        Living: 2
Gestational Age

 LMP:           41w 0d        Date:  08/18/17                 EDD:   05/25/18
 U/S Today:     34w 4d                                        EDD:   07/09/18
 Best:          36w 2d     Det. By:  Early Ultrasound         EDD:   06/27/18
                                     (11/02/17)
Anatomy

 Cranium:               Appears normal         Aortic Arch:            Previously seen
 Cavum:                 Appears normal         Ductal Arch:            Previously seen
 Ventricles:            Appears normal         Diaphragm:              Appears normal
 Choroid Plexus:        Previously seen        Stomach:                Appears normal, left
                                                                       sided
 Cerebellum:            Previously seen        Abdomen:                Previously seen
 Posterior Fossa:       Previously seen        Abdominal Wall:         Appears nml (cord
                                                                       insert, abd wall)
 Nuchal Fold:           Previously seen        Cord Vessels:           Previously seen
 Face:                  Orbits and profile     Kidneys:                Appear normal
                        previously seen
 Lips:                  Previously seen        Bladder:                Appears normal
 Thoracic:              Appears normal         Spine:                  Previously seen
 Heart:                 Appears normal         Upper Extremities:      Previously seen
                        (4CH, axis, and
                        situs)
 RVOT:                  Previously seen        Lower Extremities:      Previously seen
 LVOT:                  Previously seen
Impression

 Amniotic fluid is normal and good fetal activity is seen. Fetal
 growth is appropriate for gestational age. Antenatal testing is
 reassuring. BPP [DATE].
 Gestational diabetes. Patient takes oral hypoglycemics.
Recommendations

 -Continue weekly BPP till delivery.
                 Tiger, Abimelk

## 2020-03-04 ENCOUNTER — Other Ambulatory Visit: Payer: Self-pay | Admitting: Family Medicine

## 2020-03-04 DIAGNOSIS — N926 Irregular menstruation, unspecified: Secondary | ICD-10-CM

## 2020-03-13 ENCOUNTER — Other Ambulatory Visit: Payer: BC Managed Care – PPO

## 2020-03-27 ENCOUNTER — Ambulatory Visit
Admission: RE | Admit: 2020-03-27 | Discharge: 2020-03-27 | Disposition: A | Discharge status: Home / Self Care | Payer: BC Managed Care – PPO | Source: Ambulatory Visit | Attending: Family Medicine | Admitting: Family Medicine

## 2020-03-27 DIAGNOSIS — N926 Irregular menstruation, unspecified: Secondary | ICD-10-CM

## 2020-11-23 DIAGNOSIS — E663 Overweight: Secondary | ICD-10-CM | POA: Insufficient documentation

## 2023-06-12 DIAGNOSIS — F988 Other specified behavioral and emotional disorders with onset usually occurring in childhood and adolescence: Secondary | ICD-10-CM | POA: Insufficient documentation

## 2023-07-11 ENCOUNTER — Inpatient Hospital Stay: Payer: Self-pay | Attending: Hematology and Oncology | Admitting: Hematology and Oncology

## 2023-07-11 VITALS — BP 114/58 | HR 83 | Temp 99.6°F | Resp 16 | Ht 62.0 in | Wt 146.7 lb

## 2023-07-11 DIAGNOSIS — Z5111 Encounter for antineoplastic chemotherapy: Secondary | ICD-10-CM | POA: Diagnosis present

## 2023-07-11 DIAGNOSIS — Z5189 Encounter for other specified aftercare: Secondary | ICD-10-CM | POA: Insufficient documentation

## 2023-07-11 DIAGNOSIS — Z170 Estrogen receptor positive status [ER+]: Secondary | ICD-10-CM | POA: Insufficient documentation

## 2023-07-11 DIAGNOSIS — Z1721 Progesterone receptor positive status: Secondary | ICD-10-CM | POA: Insufficient documentation

## 2023-07-11 DIAGNOSIS — C50412 Malignant neoplasm of upper-outer quadrant of left female breast: Secondary | ICD-10-CM | POA: Insufficient documentation

## 2023-07-11 DIAGNOSIS — Z87891 Personal history of nicotine dependence: Secondary | ICD-10-CM | POA: Insufficient documentation

## 2023-07-11 DIAGNOSIS — Z1732 Human epidermal growth factor receptor 2 negative status: Secondary | ICD-10-CM | POA: Diagnosis not present

## 2023-07-11 NOTE — Progress Notes (Signed)
 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  She underwent left lumpectomy and 06/06/2023 back to Dr. 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  Oncotype DX was 27.  She was seen postoperatively by Dr. Nelida who recommended adjuvant chemotherapy with dose dense Adriamycin and Cytoxan followed by Taxol weekly x 12.  She wanted to see us  for second opinion and discuss her treatment options.   I reviewed her records extensively and collaborated the history with the patient.  SUMMARY OF ONCOLOGIC HISTORY: Oncology History  Malignant neoplasm of upper-outer quadrant of left breast in female, estrogen receptor positive (HCC)  04/26/2023 Initial Diagnosis   Mammogram detected left breast mass measuring 1.4 cm, ATM gene mutation, biopsy grade 1 invasive lobular cancer with LCIS ER 10%, PR 1%, HER2 negative, Ki-67 3%   06/06/2023 Surgery   Left lumpectomy: 2.3 cm grade 2 ILC margins -0/1 sentinel lymph node, ER 21 to 30% weak, PR 70 to 80% moderate, HER2 1+ negative, Ki-67 40%    Oncotype testing   Oncotype DX recurrence score 27      MEDICAL HISTORY:  Past Medical History:  Diagnosis Date   Gestational diabetes    glyburide     SURGICAL HISTORY: Past Surgical History:  Procedure Laterality Date   MULTIPLE TOOTH EXTRACTIONS Bilateral    for orthodontic care    SOCIAL HISTORY: Social History   Socioeconomic History   Marital status: Married    Spouse name: Not on file   Number of children:  Not on file   Years of education: Not on file   Highest education level: Not on file  Occupational History   Not on file  Tobacco Use   Smoking status: Former    Current packs/day: 0.00    Types: Cigarettes    Quit date: 06/28/2001    Years since quitting: 22.0   Smokeless tobacco: Never  Vaping Use   Vaping status: Never Used  Substance and Sexual Activity   Alcohol use: Not Currently    Alcohol/week: 0.0 standard drinks of alcohol    Comment: Ocassionally   Drug use: No   Sexual activity: Yes    Partners: Male    Birth control/protection: Other-see comments    Comment: husband has vasectomy  Other Topics Concern   Not on file  Social History Narrative   Not on file   Social Drivers of Health   Financial Resource Strain: Low Risk  (06/07/2018)   Overall Financial Resource Strain (CARDIA)    Difficulty of Paying Living Expenses: Not hard at all  Food Insecurity: No Food Insecurity (07/11/2023)   Hunger Vital Sign    Worried About Running Out of Food in the Last Year: Never true    Ran Out of Food in the Last Year: Never true  Transportation Needs: No Transportation Needs (07/11/2023)   PRAPARE - Administrator, Civil Service (Medical): No    Lack of Transportation (Non-Medical): No  Physical Activity: Not on file  Stress:  No Stress Concern Present (06/07/2018)   [employer] of Occupational Health - Occupational Stress Questionnaire    Feeling of Stress : Only a little  Social Connections: Not on file  Intimate Partner Violence: Not At Risk (07/11/2023)   Humiliation, Afraid, Rape, and Kick questionnaire    Fear of Current or Ex-Partner: No    Emotionally Abused: No    Physically Abused: No    Sexually Abused: No    FAMILY HISTORY: Family History  Problem Relation Age of Onset   Diabetes Mother    Hyperlipidemia Mother    Diabetes Father    Cancer Neg Hx     ALLERGIES:  has no known allergies.  MEDICATIONS:  Current Outpatient Medications   Medication Sig Dispense Refill   Ashwagandha (FT ASHWAGANDHA EXTRACT) 500 MG CAPS      Magnesium Oxide -Mg Supplement 400 MG CAPS      tirzepatide (ZEPBOUND) 7.5 MG/0.5ML Pen Inject 7.5 mg into the skin.     No current facility-administered medications for this visit.    REVIEW OF SYSTEMS:   Constitutional: Denies fevers, chills or abnormal night sweats  All other systems were reviewed with the patient and are negative.  PHYSICAL EXAMINATION: ECOG PERFORMANCE STATUS: 1 - Symptomatic but completely ambulatory  Vitals:   07/11/23 1151  BP: (!) 114/58  Pulse: 83  Resp: 16  Temp: 99.6 F (37.6 C)  SpO2: 100%   Filed Weights   07/11/23 1151  Weight: 146 lb 11.2 oz (66.5 kg)    GENERAL:alert, no distress and comfortable   LABORATORY DATA:  I have reviewed the data as listed Lab Results  Component Value Date   WBC 10.9 (H) 06/22/2018   HGB 9.5 (L) 06/22/2018   HCT 28.8 (L) 06/22/2018   MCV 82.5 06/22/2018   PLT 196 06/22/2018    ASSESSMENT AND PLAN:  Malignant neoplasm of upper-outer quadrant of left breast in female, estrogen receptor positive (HCC) 06/06/2023: Left lumpectomy: 2.3 cm grade 2 ILC margins -0/1 sentinel lymph node, ER 21 to 30% weak, PR 70 to 80% moderate, HER2 1+ negative, Ki-67 40% Oncotype DX recurrence score 27: High risk  Pathology and radiology counseling: Discussed with the patient, the details of pathology including the type of breast cancer,the clinical staging, the significance of ER, PR and HER-2/neu receptors and the implications for treatment. After reviewing the pathology in detail, we proceeded to discuss the different treatment options between surgery, radiation, chemotherapy, antiestrogen therapies.  Treatment plan: Different chemotherapy options were discussed including Adriamycin Cytoxan followed by Taxol versus Taxotere and Cytoxan x 4 (patient understands that the Adriamycin Cytoxan regimen is likely to be slightly superior however it  comes with more toxicities and complications) Adjuvant radiation therapy Adjuvant antiestrogen therapy  I did not recommend ovarian function suppression as adjuvant treatment plan for her because her estrogen receptor is weakly positive and Oncotype is greater than 25.  She is not eligible for the offset clinical trial.  Chemotherapy Counseling: I discussed the risks and benefits of chemotherapy including the risks of nausea/ vomiting, risk of infection from low WBC count, fatigue due to chemo or anemia, bruising or bleeding due to low platelets, mouth sores, loss/ change in taste and decreased appetite. Liver and kidney function will be monitored through out chemotherapy as abnormalities in liver and kidney function may be a side effect of treatment.  Neuropathy risk from Taxotere was discussed in detail. Risk of permanent bone marrow dysfunction and leukemia due to chemo were also  discussed.  Patient will think about the different approaches and chemotherapy options and will inform us . We discussed Dignicap as a potential option for preventing hair loss during chemo.  ATM gene mutation: I discussed with the patient that having ATM gene mutation leads to 2-3 times increased risk of breast cancer which translates into a 30% lifetime risk of breast cancer. There is also risk of colon cancer and pancreatic cancer as well as prostate cancer in men. I recommended that she undergo colonoscopy every 5 years.  Patient is contemplating on bilateral mastectomies. 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 She will inform us  of her decision and we will proceed accordingly.   All questions were answered. The patient knows to call the clinic with any problems, questions or concerns.    Viinay K Ridgely Anastacio, MD 07/11/23

## 2023-07-11 NOTE — Assessment & Plan Note (Signed)
 06/06/2023: Left lumpectomy: 2.3 cm grade 2 ILC margins -0/1 sentinel lymph node, ER 21 to 30% weak, PR 70 to 80% moderate, HER2 1+ negative, Ki-67 40% Oncotype DX recurrence score 27: High risk  Pathology and radiology counseling: Discussed with the patient, the details of pathology including the type of breast cancer,the clinical staging, the significance of ER, PR and HER-2/neu receptors and the implications for treatment. After reviewing the pathology in detail, we proceeded to discuss the different treatment options between surgery, radiation, chemotherapy, antiestrogen therapies.  Treatment plan: Different chemotherapy options were discussed including Adriamycin Cytoxan followed by Taxol versus Taxotere and Cytoxan x 4 (patient understands that the Adriamycin Cytoxan regimen is likely to be slightly superior however it comes with more toxicities and complications) Adjuvant radiation therapy Adjuvant antiestrogen therapy  I did not recommend ovarian function suppression as adjuvant treatment plan for her because her estrogen receptor is weakly positive and Oncotype is greater than 25.  She is not eligible for the offset clinical trial.  Chemotherapy Counseling: I discussed the risks and benefits of chemotherapy including the risks of nausea/ vomiting, risk of infection from low WBC count, fatigue due to chemo or anemia, bruising or bleeding due to low platelets, mouth sores, loss/ change in taste and decreased appetite. Liver and kidney function will be monitored through out chemotherapy as abnormalities in liver and kidney function may be a side effect of treatment.  Neuropathy risk from Taxotere was discussed in detail. Risk of permanent bone marrow dysfunction and leukemia due to chemo were also discussed.  Patient will think about the different approaches and chemotherapy options and will inform us . We discussed Dignicap as a potential option for preventing hair loss during chemo.  ATM gene  mutation: I discussed with the patient that having ATM gene mutation leads to 2-3 times increased risk of breast cancer which translates into a 30% lifetime risk of breast cancer. There is also risk of colon cancer and pancreatic cancer as well as prostate cancer in men. I recommended that she undergo colonoscopy every 5 years.  Patient is contemplating on bilateral mastectomies. 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 She will inform us  of her decision and we will proceed accordingly.

## 2023-07-12 ENCOUNTER — Telehealth: Payer: Self-pay | Admitting: *Deleted

## 2023-07-12 ENCOUNTER — Other Ambulatory Visit: Payer: Self-pay | Admitting: *Deleted

## 2023-07-12 DIAGNOSIS — C50412 Malignant neoplasm of upper-outer quadrant of left female breast: Secondary | ICD-10-CM

## 2023-07-12 NOTE — Progress Notes (Signed)
 Received call from Dr. Elvis office stating pt will not be able to have surgery until 9 months post chemo treatment and therefore he will not be able to place port a cath.  RN reviewed with MD and verbal orders received and placed for pt to have port placed with IR.  MD also requesting pt to have second opinion with [facility]. Referral successfully faxed.

## 2023-07-12 NOTE — Telephone Encounter (Signed)
 Received call from pt stating she wishes to proceed with Taxotere/cytoxan as well as utilize Dignicap. Per MD pt needing to have port placed by surgeon Dr. Moira so it can also be removed when she undergoes bilateral mastectomy.  RN placed call to MD office to request port a cath placement ASAP so pt can start tx. Alexis, RN verbalized understanding and states she will alert MD and will also alert our office of when pt is scheduled for port placement so we can set up tx.

## 2023-07-13 ENCOUNTER — Encounter: Payer: Self-pay | Admitting: Hematology and Oncology

## 2023-07-13 ENCOUNTER — Other Ambulatory Visit: Payer: Self-pay | Admitting: Hematology and Oncology

## 2023-07-13 DIAGNOSIS — Z170 Estrogen receptor positive status [ER+]: Secondary | ICD-10-CM

## 2023-07-13 MED ORDER — PROCHLORPERAZINE MALEATE 10 MG PO TABS: 10.0000 mg | ORAL_TABLET | Freq: Four times a day (QID) | ORAL | 1 refills | Status: DC | PRN

## 2023-07-13 MED ORDER — ONDANSETRON HCL 8 MG PO TABS: 8.0000 mg | ORAL_TABLET | Freq: Three times a day (TID) | ORAL | 1 refills | Status: DC | PRN

## 2023-07-13 MED ORDER — LIDOCAINE-PRILOCAINE 2.5-2.5 % EX CREA: TOPICAL_CREAM | CUTANEOUS | 3 refills | Status: DC

## 2023-07-13 MED ORDER — DEXAMETHASONE 4 MG PO TABS: 4.0000 mg | ORAL_TABLET | Freq: Every day | ORAL | 0 refills | Status: DC

## 2023-07-13 NOTE — Progress Notes (Signed)
 START ON PATHWAY REGIMEN - Breast     A cycle is every 21 days:     Cyclophosphamide      Docetaxel   **Always confirm dose/schedule in your pharmacy ordering system**  Patient Characteristics: Postoperative without Neoadjuvant Therapy, M0 (Pathologic Staging), Invasive Disease, Adjuvant Therapy, HER2 Negative, ER Positive, Node Negative, pT2 or Higher, pN0, Oncotype High Risk (? 26) Therapeutic Status: Postoperative without Neoadjuvant Therapy, M0 (Pathologic Staging) AJCC Grade: G2 AJCC N Category: pN0 AJCC M Category: cM0 ER Status: Positive (+) AJCC 8 Stage Grouping: IA HER2 Status: Negative (-) Oncotype Dx Recurrence Score: 27 AJCC T Category: pT2 PR Status: Positive (+) Has this patient completed genomic testing<= Yes - Oncotype DX(R) Intent of Therapy: Curative Intent, Discussed with Patient

## 2023-07-14 ENCOUNTER — Other Ambulatory Visit: Payer: Self-pay

## 2023-07-17 NOTE — Progress Notes (Addendum)
 Oncology Navigation Follow Up Encounter  Attempted to reach patient for follow up.  She declined follow up with radiation per message stating she was going to have mastectomies.  She meet with Dr. Gudena last week regarding second opinion related to the need for chemotherapy and per his notes she was to call them back to let them know what she had decided.   Per message from radiation scheduling she also told them she as transferring care to [facility] so was calling to confirm this information.  LVM asking her to return my call.   Pt called and LVM I returned her call and she has decided to have care in [place].  She plans to take TC for four cycles and then she will likely have bilateral mastectomies.  She does not want to have radiation.   She does have an appt with surgeon in [place] also but may come back in to see Dr. Moira too.  Encouraged her to call if she needed assistance with anything.      ONC Nurse Quickpick:  Coordination of Care       05/23/2023 05/25/2023 06/02/2023  Coordination of care  Care Coordination Appointment coordination Appointment coordination --    *   Genetic testing      All questions answered to patient satisfaction and patient encouraged to communicate back if further needs or concerns arise.    Suzen JONETTA Como, RN  *Some images could not be shown.

## 2023-07-18 ENCOUNTER — Other Ambulatory Visit: Payer: Self-pay

## 2023-07-19 ENCOUNTER — Other Ambulatory Visit: Payer: Self-pay

## 2023-07-19 ENCOUNTER — Other Ambulatory Visit: Payer: Self-pay | Admitting: Radiology

## 2023-07-19 NOTE — Progress Notes (Signed)
 Pharmacist Chemotherapy Monitoring - Initial Assessment    Anticipated start date: 07/26/23   The following has been reviewed per standard work regarding the patient's treatment regimen: The patient's diagnosis, treatment plan and drug doses, and organ/hematologic function Lab orders and baseline tests specific to treatment regimen  The treatment plan start date, drug sequencing, and pre-medications Prior authorization status  Patient's documented medication list, including drug-drug interaction screen and prescriptions for anti-emetics and supportive care specific to the treatment regimen The drug concentrations, fluid compatibility, administration routes, and timing of the medications to be used The patient's access for treatment and lifetime cumulative dose history, if applicable  The patient's medication allergies and previous infusion related reactions, if applicable   Changes made to treatment plan:  N/A  Follow up needed:  Pending authorization for treatment  and port placement   Ramiz Turpin, PharmD, MBA

## 2023-07-20 ENCOUNTER — Ambulatory Visit (HOSPITAL_COMMUNITY)
Admission: RE | Admit: 2023-07-20 | Discharge: 2023-07-20 | Disposition: A | Discharge status: Home / Self Care | Source: Ambulatory Visit | Attending: Hematology and Oncology | Admitting: Hematology and Oncology

## 2023-07-20 ENCOUNTER — Encounter (HOSPITAL_COMMUNITY): Payer: Self-pay

## 2023-07-20 ENCOUNTER — Ambulatory Visit (HOSPITAL_COMMUNITY)
Admission: RE | Admit: 2023-07-20 | Discharge: 2023-07-20 | Disposition: A | Discharge status: Home / Self Care | Payer: Self-pay | Source: Ambulatory Visit | Attending: Hematology and Oncology | Admitting: Hematology and Oncology

## 2023-07-20 DIAGNOSIS — Z170 Estrogen receptor positive status [ER+]: Secondary | ICD-10-CM | POA: Diagnosis not present

## 2023-07-20 DIAGNOSIS — C50412 Malignant neoplasm of upper-outer quadrant of left female breast: Secondary | ICD-10-CM | POA: Diagnosis present

## 2023-07-20 DIAGNOSIS — Z87891 Personal history of nicotine dependence: Secondary | ICD-10-CM | POA: Insufficient documentation

## 2023-07-20 HISTORY — PX: IR IMAGING GUIDED PORT INSERTION: IMG5740

## 2023-07-20 MED ORDER — LIDOCAINE HCL 1 % IJ SOLN
INTRAMUSCULAR | Status: AC
  Filled 2023-07-20: qty 20

## 2023-07-20 MED ORDER — LIDOCAINE-EPINEPHRINE 1 %-1:100000 IJ SOLN
INTRAMUSCULAR | Status: AC
  Filled 2023-07-20: qty 1

## 2023-07-20 MED ORDER — SODIUM CHLORIDE 0.9 % IV SOLN: INTRAVENOUS | Status: DC

## 2023-07-20 MED ORDER — LIDOCAINE HCL 1 % IJ SOLN
20.0000 mL | Freq: Once | INTRAMUSCULAR | Status: AC
  Administered 2023-07-20: 7 mL via INTRADERMAL

## 2023-07-20 MED ORDER — DIPHENHYDRAMINE HCL 50 MG/ML IJ SOLN
INTRAMUSCULAR | Status: AC
  Filled 2023-07-20: qty 1

## 2023-07-20 MED ORDER — HEPARIN SOD (PORK) LOCK FLUSH 100 UNIT/ML IV SOLN
500.0000 [IU] | Freq: Once | INTRAVENOUS | Status: AC
  Administered 2023-07-20: 500 [IU] via INTRAVENOUS

## 2023-07-20 MED ORDER — MIDAZOLAM HCL 2 MG/2ML IJ SOLN
INTRAMUSCULAR | Status: AC | PRN
  Administered 2023-07-20 (×4): 1 mg via INTRAVENOUS

## 2023-07-20 MED ORDER — FENTANYL CITRATE (PF) 100 MCG/2ML IJ SOLN
INTRAMUSCULAR | Status: AC | PRN
  Administered 2023-07-20: 50 ug via INTRAVENOUS

## 2023-07-20 MED ORDER — MIDAZOLAM HCL 2 MG/2ML IJ SOLN
INTRAMUSCULAR | Status: AC
  Filled 2023-07-20: qty 4

## 2023-07-20 MED ORDER — DIPHENHYDRAMINE HCL 50 MG/ML IJ SOLN
INTRAMUSCULAR | Status: AC | PRN
  Administered 2023-07-20: 25 mg via INTRAVENOUS

## 2023-07-20 MED ORDER — LIDOCAINE-EPINEPHRINE 1 %-1:100000 IJ SOLN
20.0000 mL | Freq: Once | INTRAMUSCULAR | Status: AC
  Administered 2023-07-20: 19 mL via INTRADERMAL

## 2023-07-20 MED ORDER — HEPARIN SOD (PORK) LOCK FLUSH 100 UNIT/ML IV SOLN
INTRAVENOUS | Status: AC
  Filled 2023-07-20: qty 5

## 2023-07-20 MED ORDER — FENTANYL CITRATE (PF) 100 MCG/2ML IJ SOLN
INTRAMUSCULAR | Status: AC
Start: 2023-07-20 — End: 2023-07-20
  Filled 2023-07-20: qty 2

## 2023-07-20 NOTE — Procedures (Signed)
Interventional Radiology Procedure: ° ° °Indications: Left breast cancer ° °Procedure: Port placement ° °Findings: Right jugular port, tip at SVC/RA junction ° °Complications: None °    °EBL: Minimal, less than 10 ml ° °Plan: Discharge in one hour.  Keep port site and incisions dry for at least 24 hours.   ° ° °Tecumseh Yeagley R. Willam Munford, MD  °Pager: [phone] ° °  °

## 2023-07-20 NOTE — Discharge Instructions (Addendum)
 Please call Interventional Radiology clinic [phone] with any questions or concerns.  You may remove your dressing and shower tomorrow.   DO NOT use EMLA  cream for 2 weeks after port placement -the cream will remove the surgical glue on your incision  After the procedure, it is common to have: Discomfort at the port insertion site. Bruising on the skin over the port. This should improve over 3-4 days  Follow these instructions at home:  Medication: Do not use Aspirin or ibuprofen  products, such as Advil  or Motrin , as it may increase bleeding.  You may resume your usual medications as ordered by your doctor. If your doctor prescribed antibiotics, take them as directed. Do not stop taking them just because you feel better. You need to take the full course of antibiotics.  Eating and drinking: Drink plenty of liquids to keep your urine pale yellow You can resume your regular diet as directed by your doctor   Care of the procedure site Follow instructions from your health care provider about how to take care of your port insertion site. Make sure you: After your port is placed, you will get a manufacturer's information card. The card has information about your port. Keep this card with you at all times Make sure to remember what type of port you have Take care of the port as told by your health care provider DO NOT use EMLA  cream for 2 weeks after port placement -the cream will remove the surgical glue on your incision DO NOT use any lotions, creams, or ointments on incision for 2 weeks. This will remove the surgical glue on your incision Wash your hands with soap and water before and after you change your bandage (dressing). If soap and water are not available, use hand sanitizer Change your dressing as told by your health care provider Leave skin glue, or adhesive strips in place. These skin closures may need to stay in place for 2 weeks or longer Check your port insertion site every  day for signs of infection. Check for: Redness, swelling, or pain Fluid or blood Warmth Pus or a bad smell  Activity Return to your normal activities as told by your health care provider. Ask your health care provider what activities are safe for you Do not lift anything that is heavier than 10 lb (4.5 kg), or the limit that you are told, until your health care provider says that it is safe Do not take baths, swim, or use a hot tub until your health care provider approves. Take showers only. Keep all follow-up visits as told by your doctor  Contact a health care provider if: You cannot flush your port with saline as directed, or you cannot draw blood from the port You have a fever or chills You have redness, swelling, or pain around your port insertion site You have fluid or blood coming from your port insertion site Your port insertion site feels warm to the touch You have pus or a bad smell coming from the port insertion site  Get help right away if: You have chest pain or shortness of breath You have bleeding from your port that you cannot control  Moderate Conscious Sedation-Care After  This sheet gives you information about how to care for yourself after your procedure. Your health care provider may also give you more specific instructions. If you have problems or questions, contact your health care provider.  After the procedure, it is common to have: Sleepiness for several hours. Impaired  judgment for several hours. Difficulty with balance. Vomiting if you eat too soon.  Follow these instructions at home:  Rest. Do not participate in activities where you could fall or become injured. Do not drive or use machinery. Do not drink alcohol. Do not take sleeping pills or medicines that cause drowsiness. Do not make important decisions or sign legal documents. Do not take care of children on your own.  Eating and drinking Follow the diet recommended by your health care  provider. Drink enough fluid to keep your urine pale yellow. If you vomit: Drink water, juice, or soup when you can drink without vomiting. Make sure you have little or no nausea before eating solid foods.  General instructions Take over-the-counter and prescription medicines only as told by your health care provider. Have a responsible adult stay with you for the time you are told. It is important to have someone help care for you until you are awake and alert. Do not smoke. Keep all follow-up visits as told by your health care provider. This is important.  Contact a health care provider if: You are still sleepy or having trouble with balance after 24 hours. You feel light-headed. You keep feeling nauseous or you keep vomiting. You develop a rash. You have a fever. You have redness or swelling around the IV site.  Get help right away if: You have trouble breathing. You have new-onset confusion at home.  This information is not intended to replace advice given to you by your health care provider. Make sure you discuss any questions you have with your healthcare provider.

## 2023-07-20 NOTE — H&P (Signed)
 Chief Complaint: Left breast cancer; referred for port a cath placement to assist with treatment  Referring Provider(s): Gudena,V  Supervising Physician: Philip Cornet  Patient Status: [facility] - Out-pt  History of Present Illness: Stacy [name] is a 43 y.o. female ex smoker  with history of newly diagnosed left breast cancer who presents  today for port a cath placement to assist with treatment. She is s/p left lumpectomy on 06/06/2023 .   Patient is Full Code  Past Medical History:  Diagnosis Date   Gestational diabetes    glyburide     Past Surgical History:  Procedure Laterality Date   MULTIPLE TOOTH EXTRACTIONS Bilateral    for orthodontic care    Allergies: Patient has no known allergies.  Medications: Prior to Admission medications   Medication Sig Start Date End Date Taking? Authorizing Provider  Ashwagandha (FT ASHWAGANDHA EXTRACT) 500 MG CAPS  04/10/23   [provider]  dexamethasone  (DECADRON ) 4 MG tablet Take 1 tablet (4 mg total) by mouth daily. Take 1 tablet day before chemo and 1 tablet day after chemo with food 07/13/23   Gudena, Vinay, MD  lidocaine -prilocaine  (EMLA ) cream Apply to affected area once 07/13/23   Gudena, Vinay, MD  Magnesium Oxide -Mg Supplement 400 MG CAPS  04/10/23   [provider]  ondansetron  (ZOFRAN ) 8 MG tablet Take 1 tablet (8 mg total) by mouth every 8 (eight) hours as needed for nausea or vomiting. Start on the third day after chemotherapy. 07/13/23   Gudena, Vinay, MD  prochlorperazine  (COMPAZINE ) 10 MG tablet Take 1 tablet (10 mg total) by mouth every 6 (six) hours as needed for nausea or vomiting. 07/13/23   Gudena, Vinay, MD  tirzepatide (ZEPBOUND) 7.5 MG/0.5ML Pen Inject 7.5 mg into the skin.    [provider]     Family History  Problem Relation Age of Onset   Diabetes Mother    Hyperlipidemia Mother    Diabetes Father    Cancer Neg Hx     Social History   Socioeconomic History   Marital status:  Married    Spouse name: Not on file   Number of children: Not on file   Years of education: Not on file   Highest education level: Not on file  Occupational History   Not on file  Tobacco Use   Smoking status: Former    Current packs/day: 0.00    Types: Cigarettes    Quit date: 06/28/2001    Years since quitting: 22.0   Smokeless tobacco: Never  Vaping Use   Vaping status: Never Used  Substance and Sexual Activity   Alcohol use: Not Currently    Alcohol/week: 0.0 standard drinks of alcohol    Comment: Ocassionally   Drug use: No   Sexual activity: Yes    Partners: Male    Birth control/protection: Other-see comments    Comment: husband has vasectomy  Other Topics Concern   Not on file  Social History Narrative   Not on file   Social Drivers of Health   Financial Resource Strain: Low Risk  (06/07/2018)   Overall Financial Resource Strain (CARDIA)    Difficulty of Paying Living Expenses: Not hard at all  Food Insecurity: No Food Insecurity (07/11/2023)   Hunger Vital Sign    Worried About Running Out of Food in the Last Year: Never true    Ran Out of Food in the Last Year: Never true  Transportation Needs: No Transportation Needs (07/11/2023)   PRAPARE -  Administrator, Civil Service (Medical): No    Lack of Transportation (Non-Medical): No  Physical Activity: Not on file  Stress: No Stress Concern Present (06/07/2018)   [employer] of Occupational Health - Occupational Stress Questionnaire    Feeling of Stress : Only a little  Social Connections: Not on file       Review of Systems denies fever,HA,CP,dyspnea, cough, abd/back pain,N/V or bleeding  Vital Signs: Vitals:   07/20/23 1254  BP: 122/85  Pulse: 83  Resp: 15  Temp: 98.1 F (36.7 C)  SpO2: 99%    LMP 07/17/2023   Advance Care Plan: no documents on file    Physical Exam: awake/alert; chest- CTA bilat; heart- RRR; abd-soft,+BS,NT; no LE edema  Imaging: No results  found.  Labs:  CBC: No results for input(s): WBC, HGB, HCT, PLT in the last 8760 hours.  COAGS: No results for input(s): INR, APTT in the last 8760 hours.  BMP: No results for input(s): NA, K, CL, CO2, GLUCOSE, BUN, CALCIUM, CREATININE, GFRNONAA, GFRAA in the last 8760 hours.  Invalid input(s): CMP  LIVER FUNCTION TESTS: No results for input(s): BILITOT, AST, ALT, ALKPHOS, PROT, ALBUMIN in the last 8760 hours.  TUMOR MARKERS: No results for input(s): AFPTM, CEA, CA199, CHROMGRNA in the last 8760 hours.  Assessment and Plan: 43 y.o. female ex smoker  with history of newly diagnosed left breast cancer who presents  today for port a cath placement to assist with treatment. She is s/p left lumpectomy on 06/06/2023 .Risks and benefits of image guided port-a-catheter placement was discussed with the patient including, but not limited to bleeding, infection, pneumothorax, or fibrin sheath development and need for additional procedures.  All of the patient's questions were answered, patient is agreeable to proceed. Consent signed and in chart.    Thank you for allowing our service to participate in Stacy [name] 's care.  Electronically Signed: D. Franky Rakers, PA-C   07/20/2023, 12:53 PM      I spent a total of  20 minutes   in face to face in clinical consultation, greater than 50% of which was counseling/coordinating care for port a cath placement

## 2023-07-21 ENCOUNTER — Encounter: Payer: Self-pay | Admitting: Hematology and Oncology

## 2023-07-23 NOTE — Progress Notes (Unsigned)
 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 Gailen returns for follow up as scheduled. Last seen by Dr. Gudena 07/11/23 and consented to chemo  ROS   Past Medical History:  Diagnosis Date   Gestational diabetes    glyburide      Past Surgical History:  Procedure Laterality Date   IR IMAGING GUIDED PORT INSERTION  07/20/2023   MULTIPLE TOOTH EXTRACTIONS Bilateral    for orthodontic care     Outpatient Encounter Medications as of 07/25/2023  Medication Sig Note   Ashwagandha (FT ASHWAGANDHA EXTRACT) 500 MG CAPS     dexamethasone  (DECADRON ) 4 MG tablet Take 1 tablet (4 mg total) by mouth daily. Take 1 tablet day before chemo and 1 tablet day after chemo with food 07/20/2023: Not started yet   lidocaine -prilocaine  (EMLA ) cream Apply to affected area once 07/20/2023: Not started yet   Magnesium Oxide -Mg Supplement 400 MG CAPS     ondansetron  (ZOFRAN ) 8 MG tablet Take 1 tablet (8 mg total) by mouth every 8 (eight) hours as needed for nausea or vomiting. Start on the third day after chemotherapy. 07/20/2023: Not started yet   prochlorperazine   (COMPAZINE ) 10 MG tablet Take 1 tablet (10 mg total) by mouth every 6 (six) hours as needed for nausea or vomiting. 07/20/2023: Not started yet   tirzepatide (ZEPBOUND) 7.5 MG/0.5ML Pen Inject 7.5 mg into the skin.    No facility-administered encounter medications on file as of 07/25/2023.     There were no vitals filed for this visit. There is no height or weight on file to calculate BMI.   ECOG PERFORMANCE STATUS: {CHL ONC ECOG [phone]}  PHYSICAL EXAM GENERAL:alert, no distress and comfortable SKIN: no rash  EYES: sclera clear NECK: without mass LYMPH:  no palpable cervical or supraclavicular lymphadenopathy  LUNGS: clear with normal breathing effort HEART: regular rate & rhythm, no lower extremity edema ABDOMEN: abdomen soft, non-tender and normal bowel sounds NEURO: alert & oriented x 3 with fluent speech, no focal motor/sensory deficits Breast exam:  PAC without erythema    CBC    Latest Ref Rng & Units 06/22/2018    6:03 AM 06/21/2018    8:21 AM 03/27/2018   12:33 PM  CBC  WBC 4.0 - 10.5 K/uL 10.9  7.2  7.8   Hemoglobin 12.0 - 15.0 g/dL 9.5  88.9  88.3   Hematocrit 36.0 - 46.0 % 28.8  33.5  35.7   Platelets 150 - 400 K/uL 196  231  312       CMP  No data to display            ASSESSMENT & PLAN:  Malignant neoplasm of upper-outer quadrant of left breast in female, estrogen receptor positive (HCC) -06/06/2023: Left lumpectomy: 2.3 cm grade 2 ILC margins -0/1 sentinel lymph node, ER 21 to 30% weak, PR 70 to 80% moderate, HER2 1+ negative, Ki-67 40% -Oncotype DX recurrence score 27: High risk -Pending adjuvant TC q21 days x4 -Treatment plan includes adjuvant radiation and adjuvant anti-estrogen therapy to follow   PLAN:  No orders of the defined types were placed in this encounter.     All questions were answered. The patient knows to call the clinic with any problems, questions or concerns. No barriers to learning were detected. I spent ***  counseling the patient face to face. The total time spent in the appointment was *** and more than 50% was on counseling, review of test results, and coordination of care.   Idalia Allbritton K Hildegarde Dunaway, NP 07/23/2023 9:39 PM

## 2023-07-24 ENCOUNTER — Inpatient Hospital Stay

## 2023-07-25 ENCOUNTER — Encounter: Payer: Self-pay | Admitting: Nurse Practitioner

## 2023-07-25 ENCOUNTER — Ambulatory Visit (HOSPITAL_BASED_OUTPATIENT_CLINIC_OR_DEPARTMENT_OTHER): Admitting: Nurse Practitioner

## 2023-07-25 ENCOUNTER — Ambulatory Visit

## 2023-07-25 VITALS — BP 110/64 | HR 80 | Temp 98.2°F | Resp 17 | Wt 146.2 lb

## 2023-07-25 DIAGNOSIS — Z170 Estrogen receptor positive status [ER+]: Secondary | ICD-10-CM | POA: Diagnosis not present

## 2023-07-25 DIAGNOSIS — C50412 Malignant neoplasm of upper-outer quadrant of left female breast: Secondary | ICD-10-CM

## 2023-07-25 DIAGNOSIS — Z5111 Encounter for antineoplastic chemotherapy: Secondary | ICD-10-CM | POA: Diagnosis not present

## 2023-07-25 DIAGNOSIS — Z95828 Presence of other vascular implants and grafts: Secondary | ICD-10-CM | POA: Insufficient documentation

## 2023-07-25 LAB — CMP (CANCER CENTER ONLY)
ALT: 11 U/L (ref 0–44)
AST: 12 U/L — ABNORMAL LOW (ref 15–41)
Albumin: 4.1 g/dL (ref 3.5–5.0)
Alkaline Phosphatase: 48 U/L (ref 38–126)
Anion gap: 5 (ref 5–15)
BUN: 10 mg/dL (ref 6–20)
CO2: 26 mmol/L (ref 22–32)
Calcium: 8.9 mg/dL (ref 8.9–10.3)
Chloride: 109 mmol/L (ref 98–111)
Creatinine: 0.49 mg/dL (ref 0.44–1.00)
GFR, Estimated: >60 mL/min (ref >60)
Glucose, Bld: 105 mg/dL — ABNORMAL HIGH (ref 70–99)
Potassium: 3.9 mmol/L (ref 3.5–5.1)
Sodium: 140 mmol/L (ref 135–145)
Total Bilirubin: 0.3 mg/dL (ref 0.0–1.2)
Total Protein: 6.7 g/dL (ref 6.5–8.1)

## 2023-07-25 LAB — CBC WITH DIFFERENTIAL (CANCER CENTER ONLY)
Abs Immature Granulocytes: 0.01 K/uL (ref 0.00–0.07)
Basophils Absolute: 0 K/uL (ref 0.0–0.1)
Basophils Relative: 1 %
Eosinophils Absolute: 0.1 K/uL (ref 0.0–0.5)
Eosinophils Relative: 2 %
HCT: 32.8 % — ABNORMAL LOW (ref 36.0–46.0)
Hemoglobin: 10.9 g/dL — ABNORMAL LOW (ref 12.0–15.0)
Immature Granulocytes: 0 %
Lymphocytes Relative: 25 %
Lymphs Abs: 1.4 K/uL (ref 0.7–4.0)
MCH: 26.7 pg (ref 26.0–34.0)
MCHC: 33.2 g/dL (ref 30.0–36.0)
MCV: 80.4 fL (ref 80.0–100.0)
Monocytes Absolute: 0.5 K/uL (ref 0.1–1.0)
Monocytes Relative: 9 %
Neutro Abs: 3.5 K/uL (ref 1.7–7.7)
Neutrophils Relative %: 63 %
Platelet Count: 283 K/uL (ref 150–400)
RBC: 4.08 MIL/uL (ref 3.87–5.11)
RDW: 13.9 % (ref 11.5–15.5)
WBC Count: 5.6 K/uL (ref 4.0–10.5)
nRBC: 0 % (ref 0.0–0.2)

## 2023-07-25 LAB — PREGNANCY, URINE: Preg Test, Ur: NEGATIVE

## 2023-07-25 MED ORDER — SODIUM CHLORIDE 0.9% FLUSH
10.0000 mL | Freq: Once | INTRAVENOUS | Status: AC
Start: 2023-07-25 — End: 2023-07-25
  Administered 2023-07-25: 10 mL

## 2023-07-25 MED ORDER — HEPARIN SOD (PORK) LOCK FLUSH 100 UNIT/ML IV SOLN
500.0000 [IU] | Freq: Once | INTRAVENOUS | Status: AC
Start: 2023-07-25 — End: 2023-07-25
  Administered 2023-07-25: 500 [IU]

## 2023-07-26 ENCOUNTER — Inpatient Hospital Stay: Admitting: Licensed Clinical Social Worker

## 2023-07-26 ENCOUNTER — Ambulatory Visit

## 2023-07-26 VITALS — BP 114/73 | HR 81 | Temp 97.6°F | Resp 16

## 2023-07-26 DIAGNOSIS — Z5111 Encounter for antineoplastic chemotherapy: Secondary | ICD-10-CM | POA: Diagnosis not present

## 2023-07-26 DIAGNOSIS — Z170 Estrogen receptor positive status [ER+]: Secondary | ICD-10-CM

## 2023-07-26 MED ORDER — SODIUM CHLORIDE 0.9 % IV SOLN
600.0000 mg/m2 | Freq: Once | INTRAVENOUS | Status: AC
  Administered 2023-07-26: 1000 mg via INTRAVENOUS
  Filled 2023-07-26: qty 50

## 2023-07-26 MED ORDER — SODIUM CHLORIDE 0.9 % IV SOLN
75.0000 mg/m2 | Freq: Once | INTRAVENOUS | Status: AC
  Administered 2023-07-26: 128 mg via INTRAVENOUS
  Filled 2023-07-26: qty 12.8

## 2023-07-26 MED ORDER — SODIUM CHLORIDE 0.9 % IV SOLN: INTRAVENOUS | Status: DC

## 2023-07-26 MED ORDER — SODIUM CHLORIDE 0.9% FLUSH
10.0000 mL | INTRAVENOUS | Status: DC | PRN
Start: 2023-07-26 — End: 2023-07-26
  Administered 2023-07-26: 10 mL

## 2023-07-26 MED ORDER — PALONOSETRON HCL INJECTION 0.25 MG/5ML
0.2500 mg | Freq: Once | INTRAVENOUS | Status: AC
  Administered 2023-07-26: 0.25 mg via INTRAVENOUS
  Filled 2023-07-26: qty 5

## 2023-07-26 MED ORDER — HEPARIN SOD (PORK) LOCK FLUSH 100 UNIT/ML IV SOLN
500.0000 [IU] | Freq: Once | INTRAVENOUS | Status: AC | PRN
Start: 2023-07-26 — End: 2023-07-26
  Administered 2023-07-26: 500 [IU]

## 2023-07-26 MED ORDER — DEXAMETHASONE SODIUM PHOSPHATE 10 MG/ML IJ SOLN
10.0000 mg | Freq: Once | INTRAMUSCULAR | Status: AC
  Administered 2023-07-26: 10 mg via INTRAVENOUS
  Filled 2023-07-26: qty 1

## 2023-07-26 NOTE — Progress Notes (Signed)
 [facility] Clinical Social Work  Initial Assessment   Stacy [name] is a 43 y.o. year old female accompanied by spouse, Stacy [name]. Clinical Social Work was referred by new patient protocol for assessment of psychosocial needs.   SDOH (Social Determinants of Health) assessments performed: reviewed from 07/2023   SDOH Screenings   Food Insecurity: No Food Insecurity (07/11/2023)  Housing: Low Risk  (07/25/2023)  Transportation Needs: No Transportation Needs (07/11/2023)  Utilities: Not At Risk (07/11/2023)  Depression (PHQ2-9): Low Risk  (07/25/2023)  Financial Resource Strain: Low Risk  (06/07/2018)  Stress: No Stress Concern Present (06/07/2018)  Tobacco Use: Medium Risk (07/25/2023)     Distress Screen completed: reviewed from 07/24/23    07/24/2023    6:46 PM  ONCBCN DISTRESS SCREENING  Screening Type Initial Screening  How much distress have you been experiencing in the past week? (0-10) 2  Social concerns type Relationship with children  Physical Concerns Type  Pain;Sexual health      Family/Social Information:  Housing Arrangement: patient lives with husband, kids Family members/support persons in your life? 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  Income source: Employment Financial concerns: No Type of concern: None Food access concerns: no Religious or spiritual practice: Not known Advanced directives: No Services Currently in place:  [employer] state health plan  Coping/ Adjustment to diagnosis: Patient understands treatment plan and what happens next? yes, has had lumpectomy and is here for her first chemo treatment today. She is optimistic and coping well. She is considering mastectomies based on a genetic mutation Current coping skills/ strengths: Ability for insight , Capable of independent living , Communication skills , Motivation for treatment/growth , and Supportive family/friends      SUMMARY: Current SDOH Barriers:  No major barriers identified today  Clinical Social Work Clinical Goal(s):  No clinical social work goals at this time  Interventions: Discussed common feeling and emotions when being diagnosed with cancer, and the importance of support during treatment Informed patient of the support team roles and support services at [facility] Provided CSW contact information and encouraged patient to call with any questions or concerns Provided patient with information about [employer] for potential assistance with Dignicap costs    Follow Up Plan: Patient will contact CSW with any support or resource needs Patient verbalizes understanding of plan: Yes    [name], LCSW Clinical Social Worker [facility]

## 2023-07-26 NOTE — Patient Instructions (Signed)
 [facility] - A DEPT OF [facility]  Discharge Instructions: Thank you for choosing [facility] to provide your oncology and hematology care.   If you have a lab appointment with the Cancer Center, please go directly to the Cancer Center and check in at the registration area.   Wear comfortable clothing and clothing appropriate for easy access to any Portacath or PICC line.   We strive to give you quality time with your provider. You may need to reschedule your appointment if you arrive late (15 or more minutes).  Arriving late affects you and other patients whose appointments are after yours.  Also, if you miss three or more appointments without notifying the office, you may be dismissed from the clinic at the provider's discretion.      For prescription refill requests, have your pharmacy contact our office and allow 72 hours for refills to be completed.    Today you received the following chemotherapy and/or immunotherapy agents: Docetaxel  (Taxotere ) & Cyclophosphamide  (Cytoxan )    To help prevent nausea and vomiting after your treatment, we encourage you to take your nausea medication as directed.  BELOW ARE SYMPTOMS THAT SHOULD BE REPORTED IMMEDIATELY: *FEVER GREATER THAN 100.4 F (38 C) OR HIGHER *CHILLS OR SWEATING *NAUSEA AND VOMITING THAT IS NOT CONTROLLED WITH YOUR NAUSEA MEDICATION *UNUSUAL SHORTNESS OF BREATH *UNUSUAL BRUISING OR BLEEDING *URINARY PROBLEMS (pain or burning when urinating, or frequent urination) *BOWEL PROBLEMS (unusual diarrhea, constipation, pain near the anus) TENDERNESS IN MOUTH AND THROAT WITH OR WITHOUT PRESENCE OF ULCERS (sore throat, sores in mouth, or a toothache) UNUSUAL RASH, SWELLING OR PAIN  UNUSUAL VAGINAL DISCHARGE OR ITCHING   Items with * indicate a potential emergency and should be followed up as soon as possible or go to the Emergency Department if any problems should occur.  Please show the  CHEMOTHERAPY ALERT CARD or IMMUNOTHERAPY ALERT CARD at check-in to the Emergency Department and triage nurse.  Should you have questions after your visit or need to cancel or reschedule your appointment, please contact [facility]  Dept: [phone]  and follow the prompts.  Office hours are 8:00 a.m. to 4:30 p.m. Monday - Friday. Please note that voicemails left after 4:00 p.m. may not be returned until the following business day.  We are closed weekends and major holidays. You have access to a nurse at all times for urgent questions. Please call the main number to the clinic Dept: [phone] and follow the prompts.   For any non-urgent questions, you may also contact your provider using MyChart. We now offer e-Visits for anyone 72 and older to request care online for non-urgent symptoms. For details visit [email or web address].   Also download the MyChart app! Go to the app store, search MyChart, open the app, select [facility], and log in with your MyChart username and password.

## 2023-07-28 ENCOUNTER — Ambulatory Visit

## 2023-07-28 VITALS — BP 110/75 | HR 86 | Temp 98.5°F | Resp 18

## 2023-07-28 DIAGNOSIS — Z5111 Encounter for antineoplastic chemotherapy: Secondary | ICD-10-CM | POA: Diagnosis not present

## 2023-07-28 DIAGNOSIS — Z170 Estrogen receptor positive status [ER+]: Secondary | ICD-10-CM

## 2023-07-28 MED ORDER — PEGFILGRASTIM INJECTION 6 MG/0.6ML ~~LOC~~
6.0000 mg | PREFILLED_SYRINGE | Freq: Once | SUBCUTANEOUS | Status: AC
  Administered 2023-07-28: 6 mg via SUBCUTANEOUS
  Filled 2023-07-28: qty 0.6

## 2023-07-31 ENCOUNTER — Telehealth: Payer: Self-pay

## 2023-07-31 NOTE — Telephone Encounter (Signed)
 Pt called and LVM stating she experienced a low grade fever and head ache 3 days after GCSF injection and is asking if she can take tylenol . Attempted to call pt back to address concerns. LVM for call back.

## 2023-07-31 NOTE — Telephone Encounter (Signed)
 S/w pt and she reports low grade fevers after having GCSF. She reports having h/a after shot. Advised she could take tylenol  after, but to be careful to not exceed recommended dose. If fever persists greater than 3 days to call us  but she can take tylenol  per MD. Stacy [name] says her sx have relieved, no more h/a, or fever. She verbalized thanks and understanding.

## 2023-08-14 ENCOUNTER — Ambulatory Visit

## 2023-08-14 ENCOUNTER — Telehealth: Payer: Self-pay

## 2023-08-14 NOTE — Telephone Encounter (Signed)
 Called pt to make her aware d/t scheduling conflict, she is going to see Johnston Police, PA in infusion 08/16/23. She is agreeable and knows to call with any concerns in the interim.

## 2023-08-14 NOTE — Progress Notes (Unsigned)
 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 female returns for a follow up prior to Cycle 2, Day 1 of TC chemotherapy.   Ms. Forest reports she did well with the first treatment. She experienced a headache the day of the last treatment which resolved on its own. She had bone pain after the GCSF injection which last two days. The pain was managed with tylenol  and claritin. She reports her energy levels are overall stable and she can complete her ADLs on her own. She has noticed taste changes but has increased appetite from the steroids. She is craving lots  of carbs such as pizza. She denies nausea, vomiting or bowel habit changes. She denies easy bruising or signs of bleeding. She denies fevers, chills ,sweats, shortness of breath, chest pain, cough, dizziness or neuropathy. She has no other complaints. Rest of the ROS is below.   MEDICAL HISTORY:  Past Medical History:  Diagnosis Date   Gestational diabetes    glyburide     SURGICAL HISTORY: Past Surgical History:   Procedure Laterality Date   IR IMAGING GUIDED PORT INSERTION  07/20/2023   MULTIPLE TOOTH EXTRACTIONS Bilateral    for orthodontic care    SOCIAL HISTORY: Social History   Socioeconomic History   Marital status: Married    Spouse name: Not on file   Number of children: Not on file   Years of education: Not on file   Highest education level: Not on file  Occupational History   Not on file  Tobacco Use   Smoking status: Former    Current packs/day: 0.00    Types: Cigarettes    Quit date: 06/28/2001    Years since quitting: 22.1   Smokeless tobacco: Never  Vaping Use   Vaping status: Never Used  Substance and Sexual Activity   Alcohol use: Not Currently    Alcohol/week: 0.0 standard drinks of alcohol    Comment: Ocassionally   Drug use: No   Sexual activity: Yes    Partners: Male    Birth control/protection: Other-see comments    Comment: husband has vasectomy  Other Topics Concern   Not on file  Social History Narrative   Not on file   Social Drivers of Health   Financial Resource Strain: Low Risk  (06/07/2018)   Overall Financial Resource Strain (CARDIA)    Difficulty of Paying Living Expenses: Not hard at all  Food Insecurity: No Food Insecurity (07/11/2023)   Hunger Vital Sign    Worried About Programme researcher, broadcasting/film/video in  the Last Year: Never true    Ran Out of Food in the Last Year: Never true  Transportation Needs: No Transportation Needs (07/11/2023)   PRAPARE - Administrator, Civil Service (Medical): No    Lack of Transportation (Non-Medical): No  Physical Activity: Not on file  Stress: No Stress Concern Present (06/07/2018)   [employer] of Occupational Health - Occupational Stress Questionnaire    Feeling of Stress : Only a little  Social Connections: Not on file  Intimate Partner Violence: Not At Risk (07/11/2023)   Humiliation, Afraid, Rape, and Kick questionnaire    Fear of Current or Ex-Partner: No    Emotionally Abused: No    Physically  Abused: No    Sexually Abused: No    FAMILY HISTORY: Family History  Problem Relation Age of Onset   Diabetes Mother    Hyperlipidemia Mother    Diabetes Father    Cancer Neg Hx     ALLERGIES:  has no known allergies.  MEDICATIONS:  Current Outpatient Medications  Medication Sig Dispense Refill   Ashwagandha (FT ASHWAGANDHA EXTRACT) 500 MG CAPS      dexamethasone  (DECADRON ) 4 MG tablet Take 1 tablet (4 mg total) by mouth daily. Take 1 tablet day before chemo and 1 tablet day after chemo with food 8 tablet 0   lidocaine -prilocaine  (EMLA ) cream Apply to affected area once 30 g 3   Magnesium Oxide -Mg Supplement 400 MG CAPS      ondansetron  (ZOFRAN ) 8 MG tablet Take 1 tablet (8 mg total) by mouth every 8 (eight) hours as needed for nausea or vomiting. Start on the third day after chemotherapy. 30 tablet 1   prochlorperazine  (COMPAZINE ) 10 MG tablet Take 1 tablet (10 mg total) by mouth every 6 (six) hours as needed for nausea or vomiting. 30 tablet 1   tirzepatide (ZEPBOUND) 7.5 MG/0.5ML Pen Inject 7.5 mg into the skin.     No current facility-administered medications for this visit.    REVIEW OF SYSTEMS:   Constitutional: ( - ) fevers, ( - )  chills , ( - ) night sweats Eyes: ( - ) blurriness of vision, ( - ) double vision, ( - ) watery eyes Ears, nose, mouth, throat, and face: ( - ) mucositis, ( - ) sore throat Respiratory: ( - ) cough, ( - ) dyspnea, ( - ) wheezes Cardiovascular: ( - ) palpitation, ( - ) chest discomfort, ( - ) lower extremity swelling Gastrointestinal:  ( - ) nausea, ( - ) heartburn, ( - ) change in bowel habits Skin: ( - ) abnormal skin rashes Lymphatics: ( - ) new lymphadenopathy, ( - ) easy bruising Neurological: ( - ) numbness, ( - ) tingling, ( - ) new weaknesses Behavioral/Psych: ( - ) mood change, ( - ) new changes  All other systems were reviewed with the patient and are negative.  PHYSICAL EXAMINATION: ECOG PERFORMANCE STATUS: 1 - Symptomatic but  completely ambulatory  There were no vitals filed for this visit. There were no vitals filed for this visit.  Day 1, Cycle 2 08/16/23  Weight 148 lb 8 oz (67.4 kg)  Temp 98 F (36.7 C)  Temp src Oral  Pulse 75  Resp 18  BP 111/68   GENERAL: well appearing female in NAD  SKIN: skin color, texture, turgor are normal, no rashes or significant lesions EYES: conjunctiva are pink and non-injected, sclera clear LUNGS: clear to auscultation and percussion with normal breathing effort  HEART: regular rate & rhythm and no murmurs and no lower extremity edema Musculoskeletal: no cyanosis of digits and no clubbing  PSYCH: alert & oriented x 3, fluent speech NEURO: no focal motor/sensory deficits  LABORATORY DATA:  I have reviewed the data as listed    Latest Ref Rng & Units 07/25/2023    9:15 AM 06/22/2018    6:03 AM 06/21/2018    8:21 AM  CBC  WBC 4.0 - 10.5 K/uL 5.6  10.9  7.2   Hemoglobin 12.0 - 15.0 g/dL 89.0  9.5  88.9   Hematocrit 36.0 - 46.0 % 32.8  28.8  33.5   Platelets 150 - 400 K/uL 283  196  231        Latest Ref Rng & Units 07/25/2023    9:15 AM  CMP  Glucose 70 - 99 mg/dL 894   BUN 6 - 20 mg/dL 10   Creatinine 9.55 - 1.00 mg/dL 9.50   Sodium 864 - 854 mmol/L 140   Potassium 3.5 - 5.1 mmol/L 3.9   Chloride 98 - 111 mmol/L 109   CO2 22 - 32 mmol/L 26   Calcium 8.9 - 10.3 mg/dL 8.9   Total Protein 6.5 - 8.1 g/dL 6.7   Total Bilirubin 0.0 - 1.2 mg/dL 0.3   Alkaline Phos 38 - 126 U/L 48   AST 15 - 41 U/L 12   ALT 0 - 44 U/L 11    RADIOGRAPHIC STUDIES: I have personally reviewed the radiological images as listed and agreed with the findings in the report. IR IMAGING GUIDED PORT INSERTION Result Date: 07/20/2023 INDICATION: Port-A-Cath needed for treatment of breast cancer. EXAM: FLUOROSCOPIC AND ULTRASOUND GUIDED PLACEMENT OF A SUBCUTANEOUS PORT MEDICATIONS: Benadryl  25 mg ANESTHESIA/SEDATION: Moderate (conscious) sedation was employed during this procedure. A total  of Versed  4 mg and fentanyl  100 mcg was administered intravenously at the order of the provider performing the procedure. Total intra-service moderate sedation time: 35 minutes. Patient's level of consciousness and vital signs were monitored continuously by radiology nurse throughout the procedure under the supervision of the provider performing the procedure. FLUOROSCOPY TIME:  Radiation Exposure Index (as provided by the fluoroscopic device): 1 mGy Kerma COMPLICATIONS: None immediate. PROCEDURE: The procedure, risks, benefits, and alternatives were explained to the patient. Questions regarding the procedure were encouraged and answered. The patient understands and consents to the procedure. Patient was placed supine on the interventional table. Ultrasound confirmed a patent right internal jugular vein. Ultrasound image was saved for documentation. The right chest and neck were cleaned with a skin antiseptic and a sterile drape was placed. Maximal barrier sterile technique was utilized including caps, mask, sterile gowns, sterile gloves, sterile drape, hand hygiene and skin antiseptic. The right neck was anesthetized with 1% lidocaine . Small incision was made in the right neck with a blade. Micropuncture set was placed in the right internal jugular vein with ultrasound guidance. The micropuncture wire was used for measurement purposes. The right chest was anesthetized with 1% lidocaine  with epinephrine . #15 blade was used to make an incision and a subcutaneous port pocket was formed. 8 french Power Port was assembled. Subcutaneous tunnel was formed with a stiff tunneling device. The port catheter was brought through the subcutaneous tunnel. The port was placed in the subcutaneous pocket. The micropuncture set was exchanged for a peel-away sheath. The catheter was placed through the peel-away sheath and the tip was positioned at the superior cavoatrial junction. Catheter placement was confirmed with fluoroscopy. The  port was accessed and flushed with heparinized saline. The port pocket was closed using two layers of absorbable sutures and Dermabond. The vein skin site was closed using a single layer of absorbable suture and Dermabond. Sterile dressings were applied. Patient tolerated the procedure well without an immediate complication. Ultrasound and fluoroscopic images were taken and saved for this procedure. IMPRESSION: Placement of a subcutaneous power-injectable port device. Catheter tip at the superior cavoatrial junction. Electronically Signed   By: Juliene Balder M.D.   On: 07/20/2023 18:22    ASSESSMENT & PLAN Stacy [name] is a 43 y.o. female who presents to the clinic for continued management of left breast cancer.   #Malignant neoplasm of upper-outer quadrant of left breast in female, estrogen receptor positive (HCC) -06/06/2023: Left lumpectomy: 2.3 cm grade 2 ILC margins -0/1 sentinel lymph node, ER 21 to 30% weak, PR 70 to 80% moderate, HER2 1+ negative, Ki-67 40% -Oncotype DX recurrence score 27: High risk -Recommended adjuvant TC chemotherapy q 21 days x 4 cycles, adjuvant radiation followed anti-estrogen therapy.  -Started Cycle 1, Day 1 of TC chemotherapy on 07/26/2023 PLAN: --Due for Cycle 2, Day 1 of TC chemotherapy today.  --Labs from today were reviewed and adequate for treatment. WBC 6.5, Hgb 10.0, MCV 80.8, Plt 369. Creatinine and LFTs are normal --Proceed with treatment today without any dose modifications --RTC in 3 weeks with labs and follow up prior to Cycle 3, Day 1.   #Bone pain: --Secondary to GCSF injection --Continue with claritin and tylenol  to help with pain  #Increased appetite 2/2 steroid use: --Monitor weight closely. Encouraged to eat high protein diet.  --Consider decrease in dose of steroids if she continues to gain weight.   No orders of the defined types were placed in this encounter.   All questions were answered. The patient knows to call the clinic with  any problems, questions or concerns.  I have spent a total of 30 minutes minutes of face-to-face and non-face-to-face time, preparing to see the patient, performing a medically appropriate examination, counseling and educating the patient,documenting clinical information in the electronic health record, independently interpreting results and communicating results to the patient, and care coordination.   Johnston Police, PA-C Department of Hematology/Oncology [facility] Phone: [phone]

## 2023-08-16 ENCOUNTER — Inpatient Hospital Stay: Attending: Hematology and Oncology

## 2023-08-16 ENCOUNTER — Ambulatory Visit

## 2023-08-16 ENCOUNTER — Ambulatory Visit: Attending: Hematology and Oncology | Admitting: Physician Assistant

## 2023-08-16 ENCOUNTER — Inpatient Hospital Stay

## 2023-08-16 VITALS — BP 111/68 | HR 75 | Temp 98.0°F | Resp 18 | Wt 148.5 lb

## 2023-08-16 DIAGNOSIS — Z1501 Genetic susceptibility to malignant neoplasm of breast: Secondary | ICD-10-CM | POA: Insufficient documentation

## 2023-08-16 DIAGNOSIS — Z1732 Human epidermal growth factor receptor 2 negative status: Secondary | ICD-10-CM | POA: Insufficient documentation

## 2023-08-16 DIAGNOSIS — Z170 Estrogen receptor positive status [ER+]: Secondary | ICD-10-CM

## 2023-08-16 DIAGNOSIS — Z3202 Encounter for pregnancy test, result negative: Secondary | ICD-10-CM | POA: Diagnosis not present

## 2023-08-16 DIAGNOSIS — M898X9 Other specified disorders of bone, unspecified site: Secondary | ICD-10-CM | POA: Diagnosis not present

## 2023-08-16 DIAGNOSIS — Z5189 Encounter for other specified aftercare: Secondary | ICD-10-CM | POA: Insufficient documentation

## 2023-08-16 DIAGNOSIS — L659 Nonscarring hair loss, unspecified: Secondary | ICD-10-CM | POA: Diagnosis not present

## 2023-08-16 DIAGNOSIS — D6481 Anemia due to antineoplastic chemotherapy: Secondary | ICD-10-CM | POA: Insufficient documentation

## 2023-08-16 DIAGNOSIS — R632 Polyphagia: Secondary | ICD-10-CM | POA: Diagnosis not present

## 2023-08-16 DIAGNOSIS — C50412 Malignant neoplasm of upper-outer quadrant of left female breast: Secondary | ICD-10-CM | POA: Insufficient documentation

## 2023-08-16 DIAGNOSIS — Z5111 Encounter for antineoplastic chemotherapy: Secondary | ICD-10-CM | POA: Insufficient documentation

## 2023-08-16 DIAGNOSIS — Z7952 Long term (current) use of systemic steroids: Secondary | ICD-10-CM | POA: Diagnosis not present

## 2023-08-16 DIAGNOSIS — Z95828 Presence of other vascular implants and grafts: Secondary | ICD-10-CM

## 2023-08-16 DIAGNOSIS — Z87891 Personal history of nicotine dependence: Secondary | ICD-10-CM | POA: Diagnosis not present

## 2023-08-16 DIAGNOSIS — R609 Edema, unspecified: Secondary | ICD-10-CM | POA: Insufficient documentation

## 2023-08-16 DIAGNOSIS — Z1721 Progesterone receptor positive status: Secondary | ICD-10-CM | POA: Diagnosis not present

## 2023-08-16 LAB — CBC WITH DIFFERENTIAL (CANCER CENTER ONLY)
Abs Immature Granulocytes: 0.02 K/uL (ref 0.00–0.07)
Basophils Absolute: 0 K/uL (ref 0.0–0.1)
Basophils Relative: 1 %
Eosinophils Absolute: 0 K/uL (ref 0.0–0.5)
Eosinophils Relative: 0 %
HCT: 29.5 % — ABNORMAL LOW (ref 36.0–46.0)
Hemoglobin: 10 g/dL — ABNORMAL LOW (ref 12.0–15.0)
Immature Granulocytes: 0 %
Lymphocytes Relative: 28 %
Lymphs Abs: 1.8 K/uL (ref 0.7–4.0)
MCH: 27.4 pg (ref 26.0–34.0)
MCHC: 33.9 g/dL (ref 30.0–36.0)
MCV: 80.8 fL (ref 80.0–100.0)
Monocytes Absolute: 0.7 K/uL (ref 0.1–1.0)
Monocytes Relative: 11 %
Neutro Abs: 3.9 K/uL (ref 1.7–7.7)
Neutrophils Relative %: 60 %
Platelet Count: 369 K/uL (ref 150–400)
RBC: 3.65 MIL/uL — ABNORMAL LOW (ref 3.87–5.11)
RDW: 14.9 % (ref 11.5–15.5)
WBC Count: 6.5 K/uL (ref 4.0–10.5)
nRBC: 0 % (ref 0.0–0.2)

## 2023-08-16 LAB — CMP (CANCER CENTER ONLY)
ALT: 35 U/L (ref 0–44)
AST: 19 U/L (ref 15–41)
Albumin: 4.3 g/dL (ref 3.5–5.0)
Alkaline Phosphatase: 47 U/L (ref 38–126)
Anion gap: 5 (ref 5–15)
BUN: 16 mg/dL (ref 6–20)
CO2: 30 mmol/L (ref 22–32)
Calcium: 9.2 mg/dL (ref 8.9–10.3)
Chloride: 107 mmol/L (ref 98–111)
Creatinine: 0.59 mg/dL (ref 0.44–1.00)
GFR, Estimated: >60 mL/min (ref >60)
Glucose, Bld: 116 mg/dL — ABNORMAL HIGH (ref 70–99)
Potassium: 3.8 mmol/L (ref 3.5–5.1)
Sodium: 142 mmol/L (ref 135–145)
Total Bilirubin: 0.4 mg/dL (ref 0.0–1.2)
Total Protein: 6.7 g/dL (ref 6.5–8.1)

## 2023-08-16 LAB — PREGNANCY, URINE: Preg Test, Ur: NEGATIVE

## 2023-08-16 MED ORDER — SODIUM CHLORIDE 0.9 % IV SOLN
75.0000 mg/m2 | Freq: Once | INTRAVENOUS | Status: AC
  Administered 2023-08-16: 128 mg via INTRAVENOUS
  Filled 2023-08-16: qty 12.8

## 2023-08-16 MED ORDER — DEXAMETHASONE SODIUM PHOSPHATE 10 MG/ML IJ SOLN
10.0000 mg | Freq: Once | INTRAMUSCULAR | Status: AC
  Administered 2023-08-16: 10 mg via INTRAVENOUS
  Filled 2023-08-16: qty 1

## 2023-08-16 MED ORDER — SODIUM CHLORIDE 0.9 % IV SOLN: INTRAVENOUS | Status: DC

## 2023-08-16 MED ORDER — SODIUM CHLORIDE 0.9 % IV SOLN
600.0000 mg/m2 | Freq: Once | INTRAVENOUS | Status: AC
  Administered 2023-08-16: 1000 mg via INTRAVENOUS
  Filled 2023-08-16: qty 50

## 2023-08-16 MED ORDER — SODIUM CHLORIDE 0.9% FLUSH
10.0000 mL | Freq: Once | INTRAVENOUS | Status: AC
Start: 2023-08-16 — End: 2023-08-16
  Administered 2023-08-16: 10 mL

## 2023-08-16 MED ORDER — PALONOSETRON HCL INJECTION 0.25 MG/5ML
0.2500 mg | Freq: Once | INTRAVENOUS | Status: AC
  Administered 2023-08-16: 0.25 mg via INTRAVENOUS
  Filled 2023-08-16: qty 5

## 2023-08-16 NOTE — Patient Instructions (Signed)
 [facility] - A DEPT OF [facility]  Discharge Instructions: Thank you for choosing [facility] to provide your oncology and hematology care.   If you have a lab appointment with the Cancer Center, please go directly to the Cancer Center and check in at the registration area.   Wear comfortable clothing and clothing appropriate for easy access to any Portacath or PICC line.   We strive to give you quality time with your provider. You may need to reschedule your appointment if you arrive late (15 or more minutes).  Arriving late affects you and other patients whose appointments are after yours.  Also, if you miss three or more appointments without notifying the office, you may be dismissed from the clinic at the provider's discretion.      For prescription refill requests, have your pharmacy contact our office and allow 72 hours for refills to be completed.    Today you received the following chemotherapy and/or immunotherapy agents: Docetaxel  (Taxotere ) & Cyclophosphamide  (Cytoxan )    To help prevent nausea and vomiting after your treatment, we encourage you to take your nausea medication as directed.  BELOW ARE SYMPTOMS THAT SHOULD BE REPORTED IMMEDIATELY: *FEVER GREATER THAN 100.4 F (38 C) OR HIGHER *CHILLS OR SWEATING *NAUSEA AND VOMITING THAT IS NOT CONTROLLED WITH YOUR NAUSEA MEDICATION *UNUSUAL SHORTNESS OF BREATH *UNUSUAL BRUISING OR BLEEDING *URINARY PROBLEMS (pain or burning when urinating, or frequent urination) *BOWEL PROBLEMS (unusual diarrhea, constipation, pain near the anus) TENDERNESS IN MOUTH AND THROAT WITH OR WITHOUT PRESENCE OF ULCERS (sore throat, sores in mouth, or a toothache) UNUSUAL RASH, SWELLING OR PAIN  UNUSUAL VAGINAL DISCHARGE OR ITCHING   Items with * indicate a potential emergency and should be followed up as soon as possible or go to the Emergency Department if any problems should occur.  Please show the  CHEMOTHERAPY ALERT CARD or IMMUNOTHERAPY ALERT CARD at check-in to the Emergency Department and triage nurse.  Should you have questions after your visit or need to cancel or reschedule your appointment, please contact [facility]  Dept: [phone]  and follow the prompts.  Office hours are 8:00 a.m. to 4:30 p.m. Monday - Friday. Please note that voicemails left after 4:00 p.m. may not be returned until the following business day.  We are closed weekends and major holidays. You have access to a nurse at all times for urgent questions. Please call the main number to the clinic Dept: [phone] and follow the prompts.   For any non-urgent questions, you may also contact your provider using MyChart. We now offer e-Visits for anyone 72 and older to request care online for non-urgent symptoms. For details visit [email or web address].   Also download the MyChart app! Go to the app store, search MyChart, open the app, select [facility], and log in with your MyChart username and password.

## 2023-08-17 ENCOUNTER — Ambulatory Visit

## 2023-08-17 ENCOUNTER — Other Ambulatory Visit: Payer: Self-pay

## 2023-08-17 VITALS — BP 107/60 | HR 78 | Ht 62.0 in | Wt 148.2 lb

## 2023-08-17 DIAGNOSIS — Z170 Estrogen receptor positive status [ER+]: Secondary | ICD-10-CM | POA: Diagnosis not present

## 2023-08-17 DIAGNOSIS — C50412 Malignant neoplasm of upper-outer quadrant of left female breast: Secondary | ICD-10-CM | POA: Diagnosis not present

## 2023-08-17 DIAGNOSIS — Z421 Encounter for breast reconstruction following mastectomy: Secondary | ICD-10-CM

## 2023-08-17 NOTE — Progress Notes (Signed)
 Plastic & Reconstructive Surgery New Patient Visit  Patient: Stacy [name] MRN: [id number] Date: 08/17/2023 Surgical Oncologist: Medical Oncologist:  Reason for Consult: Breast Reconstruction   History of Present Illness:  This is a 43 y.o. woman who presents in consultation for breast reconstruction.   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 Ebbie where they discussed possible mastectomies for risk reduction. She is hoping for unilateral versus bilateral mastectomies.   She currently wears a C cup bra and would ideally like to be a B cup.   Recon Specific Factors: Genetic testing: Yes  Prior XRT: No Need for adjuvant XRT? TBD  Active Smoker?: No DVT Hx/Clotting Disorder/High Risk? No BMI: Body mass index is 27.11 kg/m. Caprini: 8   Past Medical History: Past Medical History:  Diagnosis Date   Gestational diabetes    glyburide     Past Surgical History: Past Surgical History:  Procedure Laterality Date   IR IMAGING GUIDED PORT INSERTION  07/20/2023   MULTIPLE TOOTH EXTRACTIONS Bilateral    for orthodontic care    Current Medications: Current Outpatient Medications on File Prior to Visit  Medication Sig Dispense Refill   Ashwagandha (FT ASHWAGANDHA EXTRACT) 500 MG CAPS      dexamethasone  (DECADRON ) 4 MG tablet Take 1 tablet (4  mg total) by mouth daily. Take 1 tablet day before chemo and 1 tablet day after chemo with food 8 tablet 0   lidocaine -prilocaine  (EMLA ) cream Apply to affected area once 30 g 3   ondansetron  (ZOFRAN ) 8 MG tablet Take 1 tablet (8 mg total) by mouth every 8 (eight) hours as needed for nausea or vomiting. Start on the third day after chemotherapy. 30 tablet 1   prochlorperazine  (COMPAZINE ) 10 MG tablet Take 1 tablet (10 mg total) by mouth every 6 (six) hours as needed for nausea or vomiting. 30 tablet 1   Magnesium Oxide -Mg Supplement 400 MG CAPS  (Patient not taking: Reported on 08/17/2023)     tirzepatide (ZEPBOUND) 7.5 MG/0.5ML Pen Inject 7.5 mg into the skin. (Patient not taking: Reported on 08/17/2023)     Current Facility-Administered Medications on File Prior to Visit  Medication Dose Route Frequency Provider Last Rate Last Admin   0.9 %  sodium chloride  infusion   Intravenous Continuous Gudena, Vinay, MD   Stopped at 08/16/23 1625    Allergies: No Known Allergies  Family History:  Negative for bleeding/clotting disorders, problems with anesthesia, connective tissue disorders.   Social History:  Social History   Socioeconomic History   Marital status: Married    Spouse name: Not on file   Number of children: Not on file   Years of education: Not on file   Highest education level: Not on file  Occupational History   Not on file  Tobacco  Use   Smoking status: Former    Current packs/day: 0.00    Types: Cigarettes    Quit date: 06/28/2001    Years since quitting: 22.1   Smokeless tobacco: Never  Vaping Use   Vaping status: Never Used  Substance and Sexual Activity   Alcohol use: Not Currently    Alcohol/week: 0.0 standard drinks of alcohol    Comment: Ocassionally   Drug use: No   Sexual activity: Yes    Partners: Male    Birth control/protection: Other-see comments    Comment: husband has vasectomy  Other Topics Concern   Not on file  Social History Narrative   Not on  file   Social Drivers of Health   Financial Resource Strain: Low Risk  (06/07/2018)   Overall Financial Resource Strain (CARDIA)    Difficulty of Paying Living Expenses: Not hard at all  Food Insecurity: No Food Insecurity (07/11/2023)   Hunger Vital Sign    Worried About Running Out of Food in the Last Year: Never true    Ran Out of Food in the Last Year: Never true  Transportation Needs: No Transportation Needs (07/11/2023)   PRAPARE - Administrator, Civil Service (Medical): No    Lack of Transportation (Non-Medical): No  Physical Activity: Not on file  Stress: No Stress Concern Present (06/07/2018)   [employer] of Occupational Health - Occupational Stress Questionnaire    Feeling of Stress : Only a little  Social Connections: Not on file    Review of systems: 10 point review of systems performed and negative except as noted in the HPI.  Physical Exam: BP 107/60 (BP Location: Right Arm, Patient Position: Sitting, Cuff Size: Normal)   Pulse 78   Ht 5' 2 (1.575 m)   Wt 148 lb 3.2 oz (67.2 kg)   LMP 07/17/2023   SpO2 100%   BMI 27.11 kg/m  Body mass index is 27.11 kg/m. General: Well appearing, no apparent distress. Pulm: Breathing comfortably on room air without sounds/wheezing. CV: Regular rate. Good perfusion of extremities. Chest: Chest wall without abnormality or obvious deformity or asymmetry.  Breast: Bilateral grade 3 ptosis. Breasts are overall symmetric with regards to shape and contour. No palpable masses in either breast. Bilateral NAC viable and sensate. Papules everted without discharge. NACs positioned along breast meridian bilaterally. Skin quality is good. There are no skin lesions, striae, or dimpling. There are prior incisions, well healed.  Abdomen: Soft, nondistended, nontender to palpation. No palpable umbilical hernia.  3 cm of diastasis appreciated. There are no prior incisions. There is sufficient skin and subcutaneous tissue for  reconstruction of breast to about B cup.  Neuro: Moving all four extremities spontaneously.  Psych: Appropriate mood and affect.   Labs: @LASTLABBYCOMP (HGBA1C,TSH)@  @LASTLABBYCOMP (WBC,RBC,HGB,HCT,MCV,MCH,MCHC,RDWSD,PLT,MPV)@  @LASTLABBYCOMP (NA,K,CL,CARDIOXIDE,ANIONGAP,GLUCOSE,BUN,CREATBLD,CALCIUM,BILITOTAL,ALBUMIN,PROTEINTOT,ALKPHOS,AST,ALT)@  @LASTLABBYCOMP (VITAMINARETI,VITAMINB1WHO,VITB2,PLASMAVITAMI,VITAMINB12LE,D25OHT,FERRITIN,FOLATELEVEL,PTH,TIBC,MAGNESIUM3,PHOSPHORSP,COPPER ,ZINCLEVEL,PREALBUMIN)@  Imaging/Pathology:  Assessment: In summary, this is a pleasant 43 y.o. year-old woman with history of breast cancer presenting for consultation for breast reconstruction in anticipation of possible mastectomies.  I had a long discussion with the patient regarding her options for breast reconstruction. All this information discussed is included in a patient education document given to the patient.   The patient was advised that timing of reconstruction can be either immediate (at the time of mastectomy), or delayed (at a separate operation after the mastectomy), and there are advantages and disadvantages to both, depending upon the need for additional cancer treatment. We discussed that breast reconstruction has limitations. We discussed that a reconstruction can have shape  issues, be painful and is largely insensate. It is never the same as a natural breast. The general surgical risks discussed included wound infections, bleeding, scarring, chronic pain, fluid build up (seroma), and wound breakdown needing wound care, sometimes even a wound vac. Death can occur with any surgery. Sometimes a blood clot (DVT) can develop that may travel to the lungs (PE) and can be fatal. The mastectomy can have skin loss not infrequently and this can have negative implications for our reconstruction results both aesthetically and complications wise, especially increasing the risk of implant removal and the need  for skin removal leading to poor aesthetics. Sometimes chronic pain syndromes can ensue that are difficult to manage. Aesthetic outcomes were discussed in detail and the patient understands that asymmetries are common, aesthetic disappointment is possible and that revision rates are very high after all types of reconstruction to try get the result as best as possible. The patient was told that there are limitations of reconstruction, limited sometimes by the patient's characteristics and how they heal and that outcomes sometimes are unpredictable.  1. Implant based reconstruction:  The usual process of placing an expander and postoperative care was discussed. We also discussed the option of DTI (direct to implant) reconstruction. We discussed the potential use of "graft" material and the potential of a slight increase in infection. We contrasted this to the benefits of its use also. We compared and contrasted the different implants and the FDA concerns and recommendations. Specifically the patient understands that silicone implants are not implicated in autoimmune diseases but that there is a potential risk of a condition called ALCL, a lymphoma type condition that occurs particularly with the use of textures implants. The FDA and [employer]PS has not changed their stance on implants. Surgical risks besides the risks above, that were discussed, included the following, infection leading to possible loss of the expander/implant, chronic pain, aesthetic complications such as rippling, asymmetry and animation (unwanted jumping or squashing of the implant when the chest muscle contracts). We also discussed the common complication of capsular contracture that often will need revisional surgery and can be painful. We quoted national data from our society of 40% revision rates at 7 years and that revisions in other two stage or DTI reconstruction was common. We discussed pre-pectoral reconstruction, placing the devices in front  of the muscle, and that this is fairly new. We and others think it has many benefits including preventing the animation deformity and postoperative pain is much improved. The deleterious effects of radiation were discussed and that in this scenario we would only consider expanders, not direct to implant and not a flap initially. It is possible to do reconstruction but the patient understands that this has a high rate of complications.  After radiation in delayed reconstruction scenarios, a flap such as a DIEP or a back (LDMF) flap are most often needed. The risks associated with implant based breast reconstruction were discussed including: infection, seroma, hematoma, skin flap/nipple necrosis, change/loss of breast or nipple sensation, implant failure, silicone concerns, capsular contracture, Anaplastic large cell lymphoma, asymmetry, the need for secondary and tertiary procedures, donor site morbidity, scars, need for possible drains and postoperative antibiotics, fat necrosis, pain, reconstruction failure, DVT/PE, stroke and heart attack.   2. DIEP Autologous tissue reconstruction with a DIEP (tummy fat and skin) flap was described and discussed in detail. For patients with adequate fatty tissue of the lower abdomen this is a good option. The usual postoperative recovery of 6-8 weeks and sometimes  longer was discussed and the usual postoperative course was discussed.  Skin loss from the mastectomy, which is not uncommon, can affect our reconstruction efforts negatively by forcing us  to use a much bigger skin island, having higher risks of infection and skin breakdown for example and even compromising the flap survival. Risks associated with autologous based breast reconstruction were discussed including: bleeding, infection, seroma, scarring, vessel thrombosis, partial or total flap loss, asymmetry, need for secondary and tertiary procedures, fat necrosis, DVT/PE, stroke and heart attack. During surgery, the  mastectomy flaps are interrogated to determine their vascularity and perfusion status. Should the mastectomy flaps show sign of compromise, an intraoperative decision may be made to delay reconstruction. Risks discussed over and above the usual risks discussed included but were not limited to complete flap failure and the need for another type of reconstruction, chronic pain, abdominal bulge or hernia, scarring, skin breakdown, seroma (fluid build up); all sometimes needing further intervention including surgery. Hard areas (fat necrosis) can develop which are only dealt with if they are noticeable or painful. Wound complications are common and very common in ladies that have a higher BMI. I discussed this risk as being almost 100% but that the flap is still a good option given that implants have high risks also in these patients. Bleeding, infection and blood clots that could travel to the lungs are possible and walking after surgery is very important to help prevent these clots. Asymmetries, irregularities, size issues, projection issues and other shape concerns can arise and often do, that sometimes are amenable to revisions but sometimes are just a limitation of the outcome. We discussed that symmetry procedures are often necessary and are part of the reconstructive process.   3. The Latissimus Dorsi flap (Back Muscle Flap) with an expander was discussed. The patient understands the procedure, postop course and use of the different types of implants. The risk, discussed are similar to the other types of implant, expander based reconstruction except that seroma (fluid build up) risk of 18% was highlighted. This sometimes can require further surgery. Sometimes there is a risk of decreased shoulder mobility and chronic pain is possible but this is usually well tolerated form of reconstruction.   4. We also discussed the [facility] Health Act of 1998. All the questions were answered. The patient has all the  information to make an informed decision and has a good understanding of risks, benefits and limitations.  She has all the prerequisite information to make an informed decision.   5. Opioids: With regard to opioid prescriptions, the laws have been discussed, consent has been obtained, the patient has been thoroughly evaluated. The ICD-10 code is documented in the chart and will be placed on any prescriptions. We often use a multi modal approach to pain management. If further unanticipated prescribing of opioids is needed, the patient will need to visit her primary doctor or a pain specialist.    In summary, this is a very pleasant 43 y.o. F with newly diagnosed breast cancer who presents to discuss breast reconstruction in anticipation of possible mastectomies with Dr. Ebbie.   We have agreed upon staged reconstruction with the first stage being immediate bilateral tissue expander placement with mesh. If she decides to proceed with surgery I will place in prepectoral plane if the soft tissue supports it. Otherwise I will place subpectorally or not at all. The incision pattern to be discussed with Dr. Ebbie, but I do agree she is not a candidate for nipple sparing mastectomies  due to her grade of ptosis.    The time documented represents the total time spent on the day of the encounter in preparing for and completing the visit. It does not include time spent by ancillary staff, a resident, a fellow, another trainee, or, for shared visits, time spent jointly with the patient or discussing the case or the performance of other separately performed services.   Time spent: 30 minutes.    Marilea Gwynne, MD [facility]   08/17/2023 1:13 PM

## 2023-08-18 ENCOUNTER — Other Ambulatory Visit: Payer: Self-pay | Admitting: Pharmacist

## 2023-08-18 ENCOUNTER — Encounter: Payer: Self-pay | Admitting: Hematology and Oncology

## 2023-08-18 ENCOUNTER — Ambulatory Visit

## 2023-08-18 VITALS — BP 98/62 | HR 80 | Temp 98.4°F | Resp 17

## 2023-08-18 DIAGNOSIS — Z5111 Encounter for antineoplastic chemotherapy: Secondary | ICD-10-CM | POA: Diagnosis not present

## 2023-08-18 DIAGNOSIS — C50412 Malignant neoplasm of upper-outer quadrant of left female breast: Secondary | ICD-10-CM

## 2023-08-18 MED ORDER — PEGFILGRASTIM INJECTION 6 MG/0.6ML ~~LOC~~
6.0000 mg | PREFILLED_SYRINGE | Freq: Once | SUBCUTANEOUS | Status: AC
  Administered 2023-08-18: 6 mg via SUBCUTANEOUS

## 2023-08-22 NOTE — Addendum Note (Signed)
 Encounter addended by: Janice Lynwood BROCKS on: 08/22/2023 10:31 AM  Actions taken: Imaging Exam ended

## 2023-08-24 ENCOUNTER — Encounter: Payer: Self-pay | Admitting: Hematology and Oncology

## 2023-09-04 ENCOUNTER — Other Ambulatory Visit

## 2023-09-04 ENCOUNTER — Ambulatory Visit

## 2023-09-04 ENCOUNTER — Ambulatory Visit: Admitting: Hematology and Oncology

## 2023-09-05 ENCOUNTER — Inpatient Hospital Stay

## 2023-09-05 ENCOUNTER — Encounter: Payer: Self-pay | Admitting: *Deleted

## 2023-09-05 ENCOUNTER — Inpatient Hospital Stay (HOSPITAL_BASED_OUTPATIENT_CLINIC_OR_DEPARTMENT_OTHER): Admitting: Hematology and Oncology

## 2023-09-05 VITALS — BP 110/69 | HR 82 | Temp 98.0°F | Resp 17 | Wt 148.7 lb

## 2023-09-05 DIAGNOSIS — Z5111 Encounter for antineoplastic chemotherapy: Secondary | ICD-10-CM | POA: Diagnosis not present

## 2023-09-05 DIAGNOSIS — Z170 Estrogen receptor positive status [ER+]: Secondary | ICD-10-CM

## 2023-09-05 DIAGNOSIS — C50412 Malignant neoplasm of upper-outer quadrant of left female breast: Secondary | ICD-10-CM

## 2023-09-05 DIAGNOSIS — Z95828 Presence of other vascular implants and grafts: Secondary | ICD-10-CM

## 2023-09-05 LAB — CMP (CANCER CENTER ONLY)
ALT: 76 U/L — ABNORMAL HIGH (ref 0–44)
AST: 33 U/L (ref 15–41)
Albumin: 4.1 g/dL (ref 3.5–5.0)
Alkaline Phosphatase: 48 U/L (ref 38–126)
Anion gap: 6 (ref 5–15)
BUN: 14 mg/dL (ref 6–20)
CO2: 27 mmol/L (ref 22–32)
Calcium: 9.4 mg/dL (ref 8.9–10.3)
Chloride: 108 mmol/L (ref 98–111)
Creatinine: 0.43 mg/dL — ABNORMAL LOW (ref 0.44–1.00)
GFR, Estimated: >60 mL/min (ref >60)
Glucose, Bld: 139 mg/dL — ABNORMAL HIGH (ref 70–99)
Potassium: 3.8 mmol/L (ref 3.5–5.1)
Sodium: 141 mmol/L (ref 135–145)
Total Bilirubin: 0.3 mg/dL (ref 0.0–1.2)
Total Protein: 6.7 g/dL (ref 6.5–8.1)

## 2023-09-05 LAB — CBC WITH DIFFERENTIAL (CANCER CENTER ONLY)
Abs Immature Granulocytes: 0.04 K/uL (ref 0.00–0.07)
Basophils Absolute: 0 K/uL (ref 0.0–0.1)
Basophils Relative: 0 %
Eosinophils Absolute: 0 K/uL (ref 0.0–0.5)
Eosinophils Relative: 0 %
HCT: 29.6 % — ABNORMAL LOW (ref 36.0–46.0)
Hemoglobin: 10 g/dL — ABNORMAL LOW (ref 12.0–15.0)
Immature Granulocytes: 0 %
Lymphocytes Relative: 11 %
Lymphs Abs: 1 K/uL (ref 0.7–4.0)
MCH: 27.5 pg (ref 26.0–34.0)
MCHC: 33.8 g/dL (ref 30.0–36.0)
MCV: 81.3 fL (ref 80.0–100.0)
Monocytes Absolute: 0.7 K/uL (ref 0.1–1.0)
Monocytes Relative: 8 %
Neutro Abs: 7.5 K/uL (ref 1.7–7.7)
Neutrophils Relative %: 81 %
Platelet Count: 313 K/uL (ref 150–400)
RBC: 3.64 MIL/uL — ABNORMAL LOW (ref 3.87–5.11)
RDW: 16.4 % — ABNORMAL HIGH (ref 11.5–15.5)
WBC Count: 9.3 K/uL (ref 4.0–10.5)
nRBC: 0 % (ref 0.0–0.2)

## 2023-09-05 LAB — PREGNANCY, URINE: Preg Test, Ur: NEGATIVE

## 2023-09-05 MED ORDER — SODIUM CHLORIDE 0.9 % IV SOLN
75.0000 mg/m2 | Freq: Once | INTRAVENOUS | Status: AC
  Administered 2023-09-05: 128 mg via INTRAVENOUS
  Filled 2023-09-05: qty 12.8

## 2023-09-05 MED ORDER — SODIUM CHLORIDE 0.9% FLUSH
10.0000 mL | Freq: Once | INTRAVENOUS | Status: AC
  Administered 2023-09-05: 10 mL

## 2023-09-05 MED ORDER — SODIUM CHLORIDE 0.9% FLUSH: 10.0000 mL | INTRAVENOUS | Status: DC | PRN

## 2023-09-05 MED ORDER — PALONOSETRON HCL INJECTION 0.25 MG/5ML
0.2500 mg | Freq: Once | INTRAVENOUS | Status: AC
  Administered 2023-09-05: 0.25 mg via INTRAVENOUS
  Filled 2023-09-05: qty 5

## 2023-09-05 MED ORDER — SODIUM CHLORIDE 0.9 % IV SOLN
600.0000 mg/m2 | Freq: Once | INTRAVENOUS | Status: AC
  Administered 2023-09-05: 1000 mg via INTRAVENOUS
  Filled 2023-09-05: qty 50

## 2023-09-05 MED ORDER — DEXAMETHASONE SODIUM PHOSPHATE 10 MG/ML IJ SOLN
10.0000 mg | Freq: Once | INTRAMUSCULAR | Status: AC
  Administered 2023-09-05: 10 mg via INTRAVENOUS
  Filled 2023-09-05: qty 1

## 2023-09-05 MED ORDER — SODIUM CHLORIDE 0.9 % IV SOLN: INTRAVENOUS | Status: DC

## 2023-09-05 NOTE — Progress Notes (Signed)
 Patient Care Team: Jolee Madelin Patch, MD as PCP - General (Family Medicine)  DIAGNOSIS:  Encounter Diagnosis  Name Primary?   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 Her husband is present during the visit.  She experiences fluid retention and has gained twelve pounds since starting chemotherapy. Increased appetite persists despite reducing nausea medication. She has completed two rounds of chemotherapy. The first treatment was uneventful, but the second had administration issues, raising concerns about staff competence. The cooling cap was initially ineffective but was adjusted. She is taking steroids and nausea medication as part of her regimen. Tirzepatide was used for weight management before chemotherapy, obtained from a compounding pharmacy due to insurance issues.     ALLERGIES:  has no known allergies.  MEDICATIONS:  Current Outpatient Medications  Medication Sig Dispense Refill   Ashwagandha (FT  ASHWAGANDHA EXTRACT) 500 MG CAPS      dexamethasone  (DECADRON ) 4 MG tablet Take 1 tablet (4 mg total) by mouth daily. Take 1 tablet day before chemo and 1 tablet day after chemo with food 8 tablet 0   lidocaine -prilocaine  (EMLA ) cream Apply to affected area once 30 g 3   Magnesium Oxide -Mg Supplement 400 MG CAPS  (Patient not taking: Reported on 08/17/2023)     ondansetron  (ZOFRAN ) 8 MG tablet Take 1 tablet (8 mg total) by mouth every 8 (eight) hours as needed for nausea or vomiting. Start on the third day after chemotherapy. 30 tablet 1   prochlorperazine  (COMPAZINE ) 10 MG tablet Take 1 tablet (10 mg total) by mouth every 6 (six) hours as needed for nausea or vomiting. 30 tablet 1   tirzepatide (ZEPBOUND) 7.5 MG/0.5ML Pen Inject 7.5 mg into the skin. (Patient not taking: Reported on 08/17/2023)     No current facility-administered medications for this visit.    PHYSICAL EXAMINATION: ECOG PERFORMANCE STATUS: 1 - Symptomatic but completely ambulatory  Vitals:   09/05/23 1140  BP: 110/69  Pulse: 82  Resp: 17  Temp: 98 F (36.7 C)  SpO2: 100%   Filed Weights   09/05/23 1140  Weight: 148 lb 11.2 oz (67.4 kg)      LABORATORY DATA:  I have reviewed the data as listed    Latest Ref Rng & Units 08/16/2023   10:29 AM 07/25/2023    9:15 AM  CMP  Glucose 70 - 99 mg/dL 883  894   BUN 6 - 20 mg/dL 16  10   Creatinine 9.55 - 1.00  mg/dL 9.40  9.50   Sodium 864 - 145 mmol/L 142  140   Potassium 3.5 - 5.1 mmol/L 3.8  3.9   Chloride 98 - 111 mmol/L 107  109   CO2 22 - 32 mmol/L 30  26   Calcium 8.9 - 10.3 mg/dL 9.2  8.9   Total Protein 6.5 - 8.1 g/dL 6.7  6.7   Total Bilirubin 0.0 - 1.2 mg/dL 0.4  0.3   Alkaline Phos 38 - 126 U/L 47  48   AST 15 - 41 U/L 19  12   ALT 0 - 44 U/L 35  11     Lab Results  Component Value Date   WBC 9.3 09/05/2023   HGB 10.0 (L) 09/05/2023   HCT 29.6 (L) 09/05/2023   MCV 81.3 09/05/2023   PLT 313 09/05/2023   NEUTROABS 7.5 09/05/2023    ASSESSMENT &  PLAN:  Malignant neoplasm of upper-outer quadrant of left breast in female, estrogen receptor positive (HCC) 06/06/2023: Left lumpectomy: 2.3 cm grade 2 ILC margins -0/1 sentinel lymph node, ER 21 to 30% weak, PR 70 to 80% moderate, HER2 1+ negative, Ki-67 40% Oncotype DX recurrence score 27: High risk ATM gene mutation: 30% lifetime risk of breast cancer 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    Treatment plan: Adjuvant chemotherapy with Taxotere  and Cytoxan  every 3 weeks x 4 started 07/26/2023 adjuvant radiation therapy Adjuvant antiestrogen therapy -------------------------------------------------------------------------------------------------------------------------------------- Current treatment: Taxotere  and Cytoxan  cycle 3 Chemo toxicities: Bone pain from Neulasta  Increased appetite due to steroids: Discontinued steroids Alopecia: In spite of using Dignicap.  She is quite worried about the hair Chemo induced anemia: Monitor closely today's hemoglobin is 10  Patient previously lost a lot of weight with Tirzapatide.  Once she finishes with chemo she could go back on it. Patient is still debating between bilateral mastectomies versus radiation. I will get her an appointment to discuss with radiation oncology.  Return to clinic in 3 weeks for cycle 4  Assessment & Plan Breast cancer of left upper-outer quadrant, female, undergoing chemotherapy Undergoing chemotherapy for left upper-outer quadrant breast cancer. ATM mutation with 30% risk, lower than BRCA. Concern for DCIS development if tissue remains. Radiation discussed to prevent DCIS transformation. Bilateral mastectomy reduces recurrence risk to 3%. Radiation would target left breast. Concern about antiestrogen side effects and need for regular imaging. - Set up appointment with radiation oncologist before next treatment to discuss radiation therapy options. - Continue  chemotherapy as planned. - Discuss potential for antiestrogen therapy post-chemotherapy.  Chemotherapy-induced fluid retention and weight gain Fluid retention and weight gain likely due to chemotherapy and steroids. Gained 12 pounds since treatment start. Increased appetite possibly from steroids. Attempted to manage by reducing nausea medication. - Discontinue steroid pills post-chemotherapy cycle. - Reassess weight management strategies post-chemotherapy.      No orders of the defined types were placed in this encounter.  The patient has a good understanding of the overall plan. she agrees with it. she will call with any problems that may develop before the next visit here. Total time spent: 30 mins including face to face time and time spent for planning, charting and co-ordination of care   Naomi MARLA Chad, MD 09/05/23

## 2023-09-05 NOTE — Assessment & Plan Note (Signed)
 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    Treatment plan: Adjuvant chemotherapy with Taxotere  and Cytoxan  every 3 weeks x 4 started 07/26/2023 adjuvant radiation therapy Adjuvant antiestrogen therapy -------------------------------------------------------------------------------------------------------------------------------------- Current treatment: Taxotere  and Cytoxan  cycle 3 Chemo toxicities:  Return to clinic in 3 weeks for cycle 4

## 2023-09-06 ENCOUNTER — Encounter: Payer: Self-pay | Admitting: Hematology and Oncology

## 2023-09-07 ENCOUNTER — Inpatient Hospital Stay

## 2023-09-07 VITALS — BP 113/80 | HR 83 | Temp 98.5°F | Resp 16

## 2023-09-07 DIAGNOSIS — C50412 Malignant neoplasm of upper-outer quadrant of left female breast: Secondary | ICD-10-CM

## 2023-09-07 DIAGNOSIS — Z5111 Encounter for antineoplastic chemotherapy: Secondary | ICD-10-CM | POA: Diagnosis not present

## 2023-09-07 MED ORDER — PEGFILGRASTIM INJECTION 6 MG/0.6ML ~~LOC~~
6.0000 mg | PREFILLED_SYRINGE | Freq: Once | SUBCUTANEOUS | Status: AC
  Administered 2023-09-07: 6 mg via SUBCUTANEOUS
  Filled 2023-09-07: qty 0.6

## 2023-09-18 ENCOUNTER — Inpatient Hospital Stay
Admission: RE | Admit: 2023-09-18 | Discharge: 2023-09-18 | Disposition: A | Discharge status: Home / Self Care | Payer: Self-pay | Source: Ambulatory Visit | Attending: Radiation Oncology | Admitting: Radiation Oncology

## 2023-09-18 ENCOUNTER — Other Ambulatory Visit: Payer: Self-pay | Admitting: Radiation Oncology

## 2023-09-18 DIAGNOSIS — C50412 Malignant neoplasm of upper-outer quadrant of left female breast: Secondary | ICD-10-CM

## 2023-09-18 DIAGNOSIS — Z170 Estrogen receptor positive status [ER+]: Secondary | ICD-10-CM

## 2023-09-19 ENCOUNTER — Encounter: Payer: Self-pay | Admitting: *Deleted

## 2023-09-19 NOTE — Progress Notes (Signed)
 Radiation Oncology         [phone] ________________________________  Initial outpatient Consultation  Name: Stacy [name] MRN: [id number]  Date: 09/20/2023  DOB: 10-Oct-1980  RR:Anbi, Madelin Patch, MD  Odean Potts, MD   REFERRING PHYSICIAN: Odean Potts, MD  DIAGNOSIS: No diagnosis found.   Cancer Staging  No matching staging information was found for the patient.   Malignant neoplasm of upper-outer quadrant of left breast in female, estrogen receptor positive (CMS/HHS-HCC)  ER+ / PR+ / Her2-, Grade ***  CHIEF COMPLAINT: Here to discuss management of left breast cancer  HISTORY OF PRESENT ILLNESS::Stacy [name] is a 43 y.o. female who presented with breast abnormality on the following imaging: bilateral screening mammogram on 05/08/23.  No symptoms, if any, at that time, were reported. Ultrasound of breast revealed a hypoechoic mass with irregular shape and margins over the 1 o'clock position of the left breast approximately 1-2 cm from the nipple measuring approximately 0.7 x 1.1 x 1.4 cm.   Subsequently, she underwent a fine needle core biopsy of left breast on date of 05/19/23 which showed grade 1 invasive lobular carcinoma, nottingham histological grade and LCIS, partially involving intraductal papilloma .ER/PR positive, Her2-   Case was presented tot he tumor board on 05/29/23 with disposition for genetic testing and a lumpectomy with sentential lymph node with Oncotype testing.    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 ATM mutations are associated with an increased risk to develop female breast, ovarian, pancreatic, and prostate cancer.   She underwent a lumpectomy with sentinel lymph node excision on 06/06/23 under the care of Dr. Oneil Cao. Surgical pathology indicated grade 1 invasive lobular carcinoma and LCIS with no angiolymphatic vascular invasion identified. Margins are  free of tumor. Additional left breast antero-medial margin biopsy showed multiple foci of LCIS, less than 1 mm from the new anterior margin. Negative for invasive carcinoma. Examined lymph node is negative for metastatic carcinoma. Prognostic indicators significant for: estrogen receptor positive, weak 21-30% ; progesterone receptor positive, moderate 71-80%; Proliferation marker Ki67 at 40; Her2 status negative 1+.    Oncotype DX was obtained on the final surgical sample and the recurrence score of 27 predicts a risk of recurrence outside the breast over the next 9 years of ***%, if the patient's only systemic therapy is an antiestrogen for 5 years.  It also predicts a significant benefit from chemotherapy.  She was seen by Dr. Nelida who recommended adjuvant chemotherapy with dose dense Adriamycin and Cytoxan  followed by Taxol weekly x 12.   Patient was then seen in consultation with Dr. Gudena for a second opinion on 07/11/23. Different chemotherapy options were discussed including Adriamycin Cytoxan  followed by Taxol versus Taxotere  and Cytoxan  x 4. On 07/20/23, she opted to proceed with chenotherapy with adjuvant TC q21 days x4 with first infusion on 07/26/23. Chemotherapy toxicities include nausea, weight grain, and fluid retention.         PREVIOUS RADIATION THERAPY: No  PAST MEDICAL HISTORY:  has a past medical history of Gestational diabetes.    PAST SURGICAL HISTORY: Past Surgical History:  Procedure Laterality Date   IR IMAGING GUIDED PORT INSERTION  07/20/2023   MULTIPLE TOOTH EXTRACTIONS Bilateral    for orthodontic care    FAMILY HISTORY: family history includes Diabetes in her father and mother; Hyperlipidemia in her mother.  SOCIAL HISTORY:  reports that she quit smoking about 22 years ago. She has never  used smokeless tobacco. She reports that she does not currently use alcohol. She reports that she does not use drugs.  ALLERGIES: Patient has no known allergies.  MEDICATIONS:   Current Outpatient Medications  Medication Sig Dispense Refill   Ashwagandha (FT ASHWAGANDHA EXTRACT) 500 MG CAPS      dexamethasone  (DECADRON ) 4 MG tablet Take 1 tablet (4 mg total) by mouth daily. Take 1 tablet day before chemo and 1 tablet day after chemo with food 8 tablet 0   lidocaine -prilocaine  (EMLA ) cream Apply to affected area once 30 g 3   Magnesium Oxide -Mg Supplement 400 MG CAPS  (Patient not taking: Reported on 08/17/2023)     ondansetron  (ZOFRAN ) 8 MG tablet Take 1 tablet (8 mg total) by mouth every 8 (eight) hours as needed for nausea or vomiting. Start on the third day after chemotherapy. 30 tablet 1   prochlorperazine  (COMPAZINE ) 10 MG tablet Take 1 tablet (10 mg total) by mouth every 6 (six) hours as needed for nausea or vomiting. 30 tablet 1   tirzepatide (ZEPBOUND) 7.5 MG/0.5ML Pen Inject 7.5 mg into the skin. (Patient not taking: Reported on 08/17/2023)     No current facility-administered medications for this encounter.    REVIEW OF SYSTEMS: As above in HPI.   PHYSICAL EXAM:  vitals were not taken for this visit.   General: Alert and oriented, in no acute distress HEENT: Head is normocephalic. Extraocular movements are intact.  Heart: Regular in rate and rhythm with no murmurs, rubs, or gallops. Chest: Clear to auscultation bilaterally, with no rhonchi, wheezes, or rales. Abdomen: Soft, nontender, nondistended, with no rigidity or guarding. Extremities: No cyanosis or edema. Skin: No concerning lesions. Musculoskeletal: symmetric strength and muscle tone throughout. Neurologic: Cranial nerves II through XII are grossly intact. No obvious focalities. Speech is fluent. Coordination is intact. Psychiatric: Judgment and insight are intact. Affect is appropriate. Breasts: *** . No other palpable masses appreciated in the breasts or axillae *** .    ECOG = ***  0 - Asymptomatic (Fully active, able to carry on all predisease activities without restriction)  1 -  Symptomatic but completely ambulatory (Restricted in physically strenuous activity but ambulatory and able to carry out work of a light or sedentary nature. For example, light housework, office work)  2 - Symptomatic, <50% in bed during the day (Ambulatory and capable of all self care but unable to carry out any work activities. Up and about more than 50% of waking hours)  3 - Symptomatic, >50% in bed, but not bedbound (Capable of only limited self-care, confined to bed or chair 50% or more of waking hours)  4 - Bedbound (Completely disabled. Cannot carry on any self-care. Totally confined to bed or chair)  5 - Death   Raylene MM, Creech RH, Tormey DC, et al. [phone]). Toxicity and response criteria of the [facility]. Am. DOROTHA Bridges. Oncol. 5 (6): 649-55   LABORATORY DATA:   CBC    Component Value Date/Time   WBC 9.3 09/05/2023 1116   WBC 10.9 (H) 06/22/2018 0603   RBC 3.64 (L) 09/05/2023 1116   HGB 10.0 (L) 09/05/2023 1116   HGB 11.6 03/27/2018 1233   HCT 29.6 (L) 09/05/2023 1116   HCT 35.7 03/27/2018 1233   PLT 313 09/05/2023 1116   PLT 312 03/27/2018 1233   MCV 81.3 09/05/2023 1116   MCV 86 03/27/2018 1233   MCH 27.5 09/05/2023 1116   MCHC 33.8 09/05/2023 1116   RDW  16.4 (H) 09/05/2023 1116   RDW 13.4 03/27/2018 1233   LYMPHSABS 1.0 09/05/2023 1116   LYMPHSABS 1.6 12/06/2017 1001   MONOABS 0.7 09/05/2023 1116   EOSABS 0.0 09/05/2023 1116   EOSABS 0.1 12/06/2017 1001   BASOSABS 0.0 09/05/2023 1116   BASOSABS 0.0 12/06/2017 1001    CMP     Component Value Date/Time   NA 141 09/05/2023 1116   K 3.8 09/05/2023 1116   CL 108 09/05/2023 1116   CO2 27 09/05/2023 1116   GLUCOSE 139 (H) 09/05/2023 1116   BUN 14 09/05/2023 1116   CREATININE 0.43 (L) 09/05/2023 1116   CALCIUM 9.4 09/05/2023 1116   PROT 6.7 09/05/2023 1116   ALBUMIN 4.1 09/05/2023 1116   AST 33 09/05/2023 1116   ALT 76 (H) 09/05/2023 1116   ALKPHOS 48 09/05/2023 1116   BILITOT 0.3  09/05/2023 1116   GFRNONAA >60 09/05/2023 1116      RADIOGRAPHY: No results found.    IMPRESSION/PLAN: ***   It was a pleasure meeting the patient today. We discussed the risks, benefits, and side effects of radiotherapy. I recommend radiotherapy to the *** to reduce her risk of locoregional recurrence by 2/3.  We discussed that radiation would take approximately *** weeks to complete and that I would give the patient a few weeks to heal following surgery before starting treatment planning. *** If chemotherapy were to be given, this would precede radiotherapy. We spoke about acute effects including skin irritation and fatigue as well as much less common late effects including internal organ injury or irritation. We spoke about the latest technology that is used to minimize the risk of late effects for patients undergoing radiotherapy to the breast or chest wall. No guarantees of treatment were given. The patient is enthusiastic about proceeding with treatment. I look forward to participating in the patient's care.  I will await her referral back to me for postoperative follow-up and eventual CT simulation/treatment planning.  On date of service, in total, I spent *** minutes on this encounter. Patient was seen in person.   __________________________________________   Lauraine Golden, MD  This document serves as a record of services personally performed by Lauraine Golden, MD. It was created on her behalf by Reymundo Cartwright, a trained medical scribe. The creation of this record is based on the scribe's personal observations and the provider's statements to them. This document has been checked and approved by the attending provider.

## 2023-09-19 NOTE — Progress Notes (Signed)
 Location of Breast Cancer: Malignant Neoplasm of Upper-Outer Quadrant of Left Breast, Estrogen Receptor Positive   Receptor Status: ER(21 to 30%), PR (70 to 80), Her2-neu (Negative), Ki-67(40%)  Did patient present with symptoms (if so, please note symptoms) or was this found on screening mammography?:    Past/Anticipated interventions by surgeon, if any:  Oneil, MD Left Savi Lumpectomy with Sentinel Node Excision   Past/Anticipated interventions by medical oncology, if any:  09/05/2023 Odean, MD  Lymphedema issues, if any:    None   Pain issues, if any:   None   SAFETY ISSUES: Prior radiation? None Pacemaker/ICD? None Possible current pregnancy? None Is the patient on methotrexate? None  Current Complaints / other details:   None

## 2023-09-20 ENCOUNTER — Encounter: Payer: Self-pay | Admitting: Radiation Oncology

## 2023-09-20 ENCOUNTER — Other Ambulatory Visit: Payer: Self-pay

## 2023-09-20 ENCOUNTER — Ambulatory Visit
Admission: RE | Admit: 2023-09-20 | Discharge: 2023-09-20 | Discharge status: Home / Self Care | Source: Ambulatory Visit | Attending: Radiation Oncology

## 2023-09-20 ENCOUNTER — Ambulatory Visit
Admission: RE | Admit: 2023-09-20 | Discharge: 2023-09-20 | Disposition: A | Discharge status: Home / Self Care | Source: Ambulatory Visit | Attending: Radiation Oncology | Admitting: Radiation Oncology

## 2023-09-20 VITALS — BP 129/78 | HR 85 | Temp 97.5°F | Resp 18 | Ht 62.0 in | Wt 154.8 lb

## 2023-09-20 DIAGNOSIS — Z1721 Progesterone receptor positive status: Secondary | ICD-10-CM | POA: Insufficient documentation

## 2023-09-20 DIAGNOSIS — C50412 Malignant neoplasm of upper-outer quadrant of left female breast: Secondary | ICD-10-CM | POA: Diagnosis present

## 2023-09-20 DIAGNOSIS — Z1732 Human epidermal growth factor receptor 2 negative status: Secondary | ICD-10-CM | POA: Insufficient documentation

## 2023-09-20 DIAGNOSIS — Z7952 Long term (current) use of systemic steroids: Secondary | ICD-10-CM | POA: Diagnosis not present

## 2023-09-20 DIAGNOSIS — Z87891 Personal history of nicotine dependence: Secondary | ICD-10-CM | POA: Diagnosis not present

## 2023-09-20 DIAGNOSIS — Z1501 Genetic susceptibility to malignant neoplasm of breast: Secondary | ICD-10-CM | POA: Insufficient documentation

## 2023-09-20 DIAGNOSIS — Z170 Estrogen receptor positive status [ER+]: Secondary | ICD-10-CM | POA: Diagnosis not present

## 2023-09-21 ENCOUNTER — Other Ambulatory Visit: Payer: Self-pay

## 2023-09-22 NOTE — Progress Notes (Signed)
 [facility] Clinical Social Work  Clinical Social Work was referred by medical provider for emotional support.  Clinical Social Worker contacted patient by phone to offer support and assess for needs.     Interventions: Patient interviewed and appropriate assessments performed: brief mental health assessment    Pt reports she is doing well and is aware of support services such as counseling, support group, classes, and community referrals for other needs. She has CSW contact information, and expresses no needs at this time.   Follow Up Plan:  Patient will contact CSW with any support or resource needs    Stacy [name]  Clinical Social Work Intern [facility]

## 2023-09-27 ENCOUNTER — Inpatient Hospital Stay

## 2023-09-27 ENCOUNTER — Inpatient Hospital Stay: Attending: Hematology and Oncology

## 2023-09-27 ENCOUNTER — Encounter: Payer: Self-pay | Admitting: Hematology and Oncology

## 2023-09-27 ENCOUNTER — Inpatient Hospital Stay (HOSPITAL_BASED_OUTPATIENT_CLINIC_OR_DEPARTMENT_OTHER): Admitting: Hematology and Oncology

## 2023-09-27 ENCOUNTER — Encounter: Payer: Self-pay | Admitting: *Deleted

## 2023-09-27 VITALS — BP 100/70 | HR 91 | Temp 98.1°F | Resp 18 | Ht 62.0 in | Wt 154.0 lb

## 2023-09-27 DIAGNOSIS — Z170 Estrogen receptor positive status [ER+]: Secondary | ICD-10-CM | POA: Insufficient documentation

## 2023-09-27 DIAGNOSIS — Z5189 Encounter for other specified aftercare: Secondary | ICD-10-CM | POA: Insufficient documentation

## 2023-09-27 DIAGNOSIS — Z5111 Encounter for antineoplastic chemotherapy: Secondary | ICD-10-CM | POA: Diagnosis present

## 2023-09-27 DIAGNOSIS — M898X9 Other specified disorders of bone, unspecified site: Secondary | ICD-10-CM | POA: Insufficient documentation

## 2023-09-27 DIAGNOSIS — L659 Nonscarring hair loss, unspecified: Secondary | ICD-10-CM | POA: Diagnosis not present

## 2023-09-27 DIAGNOSIS — T451X5A Adverse effect of antineoplastic and immunosuppressive drugs, initial encounter: Secondary | ICD-10-CM | POA: Insufficient documentation

## 2023-09-27 DIAGNOSIS — Z1732 Human epidermal growth factor receptor 2 negative status: Secondary | ICD-10-CM | POA: Insufficient documentation

## 2023-09-27 DIAGNOSIS — R5383 Other fatigue: Secondary | ICD-10-CM | POA: Diagnosis not present

## 2023-09-27 DIAGNOSIS — D6481 Anemia due to antineoplastic chemotherapy: Secondary | ICD-10-CM | POA: Diagnosis not present

## 2023-09-27 DIAGNOSIS — C50412 Malignant neoplasm of upper-outer quadrant of left female breast: Secondary | ICD-10-CM

## 2023-09-27 DIAGNOSIS — Z1721 Progesterone receptor positive status: Secondary | ICD-10-CM | POA: Insufficient documentation

## 2023-09-27 LAB — CBC WITH DIFFERENTIAL (CANCER CENTER ONLY)
Abs Immature Granulocytes: 0.02 K/uL (ref 0.00–0.07)
Basophils Absolute: 0 K/uL (ref 0.0–0.1)
Basophils Relative: 1 %
Eosinophils Absolute: 0 K/uL (ref 0.0–0.5)
Eosinophils Relative: 0 %
HCT: 30 % — ABNORMAL LOW (ref 36.0–46.0)
Hemoglobin: 10 g/dL — ABNORMAL LOW (ref 12.0–15.0)
Immature Granulocytes: 0 %
Lymphocytes Relative: 19 %
Lymphs Abs: 1 K/uL (ref 0.7–4.0)
MCH: 28.1 pg (ref 26.0–34.0)
MCHC: 33.3 g/dL (ref 30.0–36.0)
MCV: 84.3 fL (ref 80.0–100.0)
Monocytes Absolute: 0.5 K/uL (ref 0.1–1.0)
Monocytes Relative: 10 %
Neutro Abs: 3.8 K/uL (ref 1.7–7.7)
Neutrophils Relative %: 70 %
Platelet Count: 293 K/uL (ref 150–400)
RBC: 3.56 MIL/uL — ABNORMAL LOW (ref 3.87–5.11)
RDW: 17.7 % — ABNORMAL HIGH (ref 11.5–15.5)
WBC Count: 5.4 K/uL (ref 4.0–10.5)
nRBC: 0 % (ref 0.0–0.2)

## 2023-09-27 LAB — CMP (CANCER CENTER ONLY)
ALT: 42 U/L (ref 0–44)
AST: 24 U/L (ref 15–41)
Albumin: 4 g/dL (ref 3.5–5.0)
Alkaline Phosphatase: 48 U/L (ref 38–126)
Anion gap: 5 (ref 5–15)
BUN: 17 mg/dL (ref 6–20)
CO2: 27 mmol/L (ref 22–32)
Calcium: 8.9 mg/dL (ref 8.9–10.3)
Chloride: 107 mmol/L (ref 98–111)
Creatinine: 0.53 mg/dL (ref 0.44–1.00)
GFR, Estimated: >60 mL/min (ref >60)
Glucose, Bld: 137 mg/dL — ABNORMAL HIGH (ref 70–99)
Potassium: 3.9 mmol/L (ref 3.5–5.1)
Sodium: 139 mmol/L (ref 135–145)
Total Bilirubin: 0.3 mg/dL (ref 0.0–1.2)
Total Protein: 6.4 g/dL — ABNORMAL LOW (ref 6.5–8.1)

## 2023-09-27 LAB — PREGNANCY, URINE: Preg Test, Ur: NEGATIVE

## 2023-09-27 MED ORDER — PALONOSETRON HCL INJECTION 0.25 MG/5ML
0.2500 mg | Freq: Once | INTRAVENOUS | Status: AC
  Administered 2023-09-27: 0.25 mg via INTRAVENOUS
  Filled 2023-09-27: qty 5

## 2023-09-27 MED ORDER — SODIUM CHLORIDE 0.9 % IV SOLN
75.0000 mg/m2 | Freq: Once | INTRAVENOUS | Status: AC
  Administered 2023-09-27: 128 mg via INTRAVENOUS
  Filled 2023-09-27: qty 12.8

## 2023-09-27 MED ORDER — DEXAMETHASONE SODIUM PHOSPHATE 10 MG/ML IJ SOLN
10.0000 mg | Freq: Once | INTRAMUSCULAR | Status: AC
  Administered 2023-09-27: 10 mg via INTRAVENOUS
  Filled 2023-09-27: qty 1

## 2023-09-27 MED ORDER — SODIUM CHLORIDE 0.9 % IV SOLN
600.0000 mg/m2 | Freq: Once | INTRAVENOUS | Status: AC
  Administered 2023-09-27: 1000 mg via INTRAVENOUS
  Filled 2023-09-27: qty 50

## 2023-09-27 MED ORDER — SODIUM CHLORIDE 0.9 % IV SOLN: INTRAVENOUS | Status: DC

## 2023-09-27 NOTE — Progress Notes (Signed)
 Patient Care Team: Jolee Madelin Patch, MD as PCP - General (Family Medicine) Ebbie Cough, MD as Consulting Physician (General Surgery) Odean Potts, MD as Consulting Physician (Hematology and Oncology) Gerome, Devere HERO, RN as Oncology Nurse Navigator Tyree Nanetta SAILOR, RN as Oncology Nurse Navigator  DIAGNOSIS:  Encounter Diagnosis  Name Primary?   Malignant neoplasm of upper-outer quadrant of left breast in female, estrogen receptor positive (HCC) Yes    SUMMARY OF ONCOLOGIC HISTORY: Oncology History  Malignant neoplasm of upper-outer quadrant of left breast in female, estrogen receptor positive (HCC)  04/26/2023 Initial Diagnosis   Mammogram detected left breast mass measuring 1.4 cm, ATM gene mutation, biopsy grade 1 invasive lobular cancer with LCIS ER 10%, PR 1%, HER2 negative, Ki-67 3%   06/06/2023 Surgery   Left lumpectomy: 2.3 cm grade 2 ILC margins -0/1 sentinel lymph node, ER 21 to 30% weak, PR 70 to 80% moderate, HER2 1+ negative, Ki-67 40%    Oncotype testing   Oncotype DX recurrence score 27   07/26/2023 -  Chemotherapy   Patient is on Treatment Plan : BREAST TC q21d       CHIEF COMPLIANT: Cycle 4 Taxotere  and Cytoxan   HISTORY OF PRESENT ILLNESS:   History of Present Illness Stacy [name] is a 43 year old female undergoing chemotherapy for breast cancer who presents for follow-up after her final chemotherapy session.  She experiences increased fatigue during her last chemotherapy cycle, affecting her ability to exercise. Her recent blood work shows a white blood cell count of 5.4 and a hemoglobin level of 10.0, consistent with previous cycles.  Three days ago, she developed a cough without fever or phlegm, now improved to itchiness. Family members have similar symptoms without fever.  She notes itchiness at the lumpectomy site, which she attributes to healing.     ALLERGIES:  has no known allergies.  MEDICATIONS:  Current Outpatient  Medications  Medication Sig Dispense Refill   Ashwagandha (FT ASHWAGANDHA EXTRACT) 500 MG CAPS  (Patient not taking: Reported on 09/27/2023)     dexamethasone  (DECADRON ) 4 MG tablet Take 1 tablet (4 mg total) by mouth daily. Take 1 tablet day before chemo and 1 tablet day after chemo with food (Patient not taking: Reported on 09/27/2023) 8 tablet 0   lidocaine -prilocaine  (EMLA ) cream Apply to affected area once 30 g 3   Magnesium Oxide -Mg Supplement 400 MG CAPS  (Patient not taking: Reported on 09/27/2023)     ondansetron  (ZOFRAN ) 8 MG tablet Take 1 tablet (8 mg total) by mouth every 8 (eight) hours as needed for nausea or vomiting. Start on the third day after chemotherapy. (Patient not taking: Reported on 09/27/2023) 30 tablet 1   prochlorperazine  (COMPAZINE ) 10 MG tablet Take 1 tablet (10 mg total) by mouth every 6 (six) hours as needed for nausea or vomiting. 30 tablet 1   tirzepatide (ZEPBOUND) 7.5 MG/0.5ML Pen Inject 7.5 mg into the skin. (Patient not taking: Reported on 09/27/2023)     No current facility-administered medications for this visit.    PHYSICAL EXAMINATION: ECOG PERFORMANCE STATUS: 1 - Symptomatic but completely ambulatory  Vitals:   09/27/23 1044  BP: 100/70  Pulse: 91  Resp: 18  Temp: 98.1 F (36.7 C)  SpO2: 100%   Filed Weights   09/27/23 1044  Weight: 154 lb (69.9 kg)    Physical Exam GENERAL: Normal  (exam performed in the presence of a chaperone)  LABORATORY DATA:  I have reviewed the data as listed  Latest Ref Rng & Units 09/05/2023   11:16 AM 08/16/2023   10:29 AM 07/25/2023    9:15 AM  CMP  Glucose 70 - 99 mg/dL 860  883  894   BUN 6 - 20 mg/dL 14  16  10    Creatinine 0.44 - 1.00 mg/dL 9.56  9.40  9.50   Sodium 135 - 145 mmol/L 141  142  140   Potassium 3.5 - 5.1 mmol/L 3.8  3.8  3.9   Chloride 98 - 111 mmol/L 108  107  109   CO2 22 - 32 mmol/L 27  30  26    Calcium 8.9 - 10.3 mg/dL 9.4  9.2  8.9   Total Protein 6.5 - 8.1 g/dL 6.7  6.7  6.7    Total Bilirubin 0.0 - 1.2 mg/dL 0.3  0.4  0.3   Alkaline Phos 38 - 126 U/L 48  47  48   AST 15 - 41 U/L 33  19  12   ALT 0 - 44 U/L 76  35  11     Lab Results  Component Value Date   WBC 5.4 09/27/2023   HGB 10.0 (L) 09/27/2023   HCT 30.0 (L) 09/27/2023   MCV 84.3 09/27/2023   PLT 293 09/27/2023   NEUTROABS 3.8 09/27/2023    ASSESSMENT & PLAN:  Malignant neoplasm of upper-outer quadrant of left breast in female, estrogen receptor positive (HCC) 06/06/2023: Left lumpectomy: 2.3 cm grade 2 ILC margins -0/1 sentinel lymph node, ER 21 to 30% weak, PR 70 to 80% moderate, HER2 1+ negative, Ki-67 40% Oncotype DX recurrence score 27: High risk ATM gene mutation: 30% lifetime risk of breast cancer 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    Treatment plan: Adjuvant chemotherapy with Taxotere  and Cytoxan  every 3 weeks x 4 started 07/26/2023 adjuvant radiation therapy Adjuvant antiestrogen therapy -------------------------------------------------------------------------------------------------------------------------------------- Current treatment: Taxotere  and Cytoxan  cycle 4 Chemo toxicities: Bone pain from Neulasta  Increased appetite due to steroids: Discontinued steroids Alopecia: In spite of using Dignicap.  She is quite worried about the hair Chemo induced anemia: Monitor closely today's hemoglobin is 10   Patient previously lost a lot of weight with Tirzapatide.  Once she finishes with chemo she could go back on it. Patient is still debating between bilateral mastectomies versus radiation. I will get her an appointment to discuss with radiation oncology.   I recommended that the port can be removed.  She will let us  know who placed it. Return to clinic after radiation is complete We briefly discussed about guardant reveal for MRD testing  Assessment & Plan Malignant neoplasm of upper-outer quadrant of left breast, status post  chemotherapy Completed chemotherapy for estrogen receptor-positive breast cancer. Hemoglobin at 10.0 g/dL, WBC at 5.4. Lumpectomy area healing. Radiation therapy scheduled, expected to cause soreness, redness, irritation. Post-radiation, hormone therapy planned. Surveillance to include Garbant blood test. - Proceed with scheduled radiation therapy. - Initiate hormone therapy a few weeks after completion of radiation therapy. - Arrange for port removal with the surgeon post-radiation. - Discuss surveillance strategies, including the novel Garbant blood test, after radiation therapy.  Chemotherapy-induced fatigue Increased fatigue post-final chemotherapy cycle, expected to persist or worsen. Gradual improvement anticipated post-chemotherapy.      No orders of the defined types were placed in this encounter.  The patient has a good understanding of the overall plan. she agrees with it. she will call with any problems that may develop before the  next visit here. Total time spent: 30 mins including face to face time and time spent for planning, charting and co-ordination of care   Naomi MARLA Chad, MD 09/27/23

## 2023-09-27 NOTE — Assessment & Plan Note (Signed)
 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    Treatment plan: Adjuvant chemotherapy with Taxotere  and Cytoxan  every 3 weeks x 4 started 07/26/2023 adjuvant radiation therapy Adjuvant antiestrogen therapy -------------------------------------------------------------------------------------------------------------------------------------- Current treatment: Taxotere  and Cytoxan  cycle 4 Chemo toxicities: Bone pain from Neulasta  Increased appetite due to steroids: Discontinued steroids Alopecia: In spite of using Dignicap.  She is quite worried about the hair Chemo induced anemia: Monitor closely today's hemoglobin is 10   Patient previously lost a lot of weight with Tirzapatide.  Once she finishes with chemo she could go back on it. Patient is still debating between bilateral mastectomies versus radiation. I will get her an appointment to discuss with radiation oncology.   Return to clinic after radiation is completed

## 2023-09-27 NOTE — Patient Instructions (Signed)
 [facility] - A DEPT OF [facility]  Discharge Instructions: Thank you for choosing [facility] to provide your oncology and hematology care.   If you have a lab appointment with the Cancer Center, please go directly to the Cancer Center and check in at the registration area.   Wear comfortable clothing and clothing appropriate for easy access to any Portacath or PICC line.   We strive to give you quality time with your provider. You may need to reschedule your appointment if you arrive late (15 or more minutes).  Arriving late affects you and other patients whose appointments are after yours.  Also, if you miss three or more appointments without notifying the office, you may be dismissed from the clinic at the provider's discretion.      For prescription refill requests, have your pharmacy contact our office and allow 72 hours for refills to be completed.    Today you received the following chemotherapy and/or immunotherapy agents: Docetaxel & Cytoxan      To help prevent nausea and vomiting after your treatment, we encourage you to take your nausea medication as directed.  BELOW ARE SYMPTOMS THAT SHOULD BE REPORTED IMMEDIATELY: *FEVER GREATER THAN 100.4 F (38 C) OR HIGHER *CHILLS OR SWEATING *NAUSEA AND VOMITING THAT IS NOT CONTROLLED WITH YOUR NAUSEA MEDICATION *UNUSUAL SHORTNESS OF BREATH *UNUSUAL BRUISING OR BLEEDING *URINARY PROBLEMS (pain or burning when urinating, or frequent urination) *BOWEL PROBLEMS (unusual diarrhea, constipation, pain near the anus) TENDERNESS IN MOUTH AND THROAT WITH OR WITHOUT PRESENCE OF ULCERS (sore throat, sores in mouth, or a toothache) UNUSUAL RASH, SWELLING OR PAIN  UNUSUAL VAGINAL DISCHARGE OR ITCHING   Items with * indicate a potential emergency and should be followed up as soon as possible or go to the Emergency Department if any problems should occur.  Please show the CHEMOTHERAPY ALERT CARD or  IMMUNOTHERAPY ALERT CARD at check-in to the Emergency Department and triage nurse.  Should you have questions after your visit or need to cancel or reschedule your appointment, please contact [facility] - A DEPT OF [facility]  Dept: [phone]  and follow the prompts.  Office hours are 8:00 a.m. to 4:30 p.m. Monday - Friday. Please note that voicemails left after 4:00 p.m. may not be returned until the following business day.  We are closed weekends and major holidays. You have access to a nurse at all times for urgent questions. Please call the main number to the clinic Dept: [phone] and follow the prompts.   For any non-urgent questions, you may also contact your provider using MyChart. We now offer e-Visits for anyone 45 and older to request care online for non-urgent symptoms. For details visit [email or web address].   Also download the MyChart app! Go to the app store, search "MyChart", open the app, select [place], and log in with your MyChart username and password.

## 2023-09-29 ENCOUNTER — Inpatient Hospital Stay

## 2023-09-29 VITALS — BP 99/73 | HR 85 | Temp 98.3°F | Resp 20

## 2023-09-29 DIAGNOSIS — Z5111 Encounter for antineoplastic chemotherapy: Secondary | ICD-10-CM | POA: Diagnosis not present

## 2023-09-29 DIAGNOSIS — Z170 Estrogen receptor positive status [ER+]: Secondary | ICD-10-CM

## 2023-09-29 MED ORDER — PEGFILGRASTIM INJECTION 6 MG/0.6ML ~~LOC~~
6.0000 mg | PREFILLED_SYRINGE | Freq: Once | SUBCUTANEOUS | Status: AC
  Administered 2023-09-29: 6 mg via SUBCUTANEOUS
  Filled 2023-09-29: qty 0.6

## 2023-10-02 ENCOUNTER — Encounter: Payer: Self-pay | Admitting: Hematology and Oncology

## 2023-10-06 ENCOUNTER — Other Ambulatory Visit: Payer: Self-pay

## 2023-10-10 ENCOUNTER — Ambulatory Visit
Admission: RE | Admit: 2023-10-10 | Discharge: 2023-10-10 | Disposition: A | Discharge status: Home / Self Care | Source: Ambulatory Visit | Attending: Radiation Oncology | Admitting: Radiation Oncology

## 2023-10-10 DIAGNOSIS — Z1721 Progesterone receptor positive status: Secondary | ICD-10-CM | POA: Insufficient documentation

## 2023-10-10 DIAGNOSIS — Z170 Estrogen receptor positive status [ER+]: Secondary | ICD-10-CM | POA: Insufficient documentation

## 2023-10-10 DIAGNOSIS — C50412 Malignant neoplasm of upper-outer quadrant of left female breast: Secondary | ICD-10-CM | POA: Diagnosis present

## 2023-10-10 DIAGNOSIS — Z1732 Human epidermal growth factor receptor 2 negative status: Secondary | ICD-10-CM | POA: Diagnosis not present

## 2023-10-11 ENCOUNTER — Encounter: Payer: Self-pay | Admitting: Hematology and Oncology

## 2023-10-11 ENCOUNTER — Other Ambulatory Visit: Payer: Self-pay | Admitting: *Deleted

## 2023-10-11 MED ORDER — VALACYCLOVIR HCL 1 G PO TABS: 1000.0000 mg | ORAL_TABLET | Freq: Two times a day (BID) | ORAL | 0 refills | Status: DC

## 2023-10-11 NOTE — Progress Notes (Signed)
 Received mychart message from pt with complaint of vaginal herpes flare up.  RN reviewed with MD and verbal orders received and placed for pt to be prescribed Valtrex  1,000 mg p.o BID x7 days.  Prescription sent to pharmacy on file, pt educated and verbalized understanding.

## 2023-10-16 DIAGNOSIS — Z170 Estrogen receptor positive status [ER+]: Secondary | ICD-10-CM | POA: Insufficient documentation

## 2023-10-16 DIAGNOSIS — Z1732 Human epidermal growth factor receptor 2 negative status: Secondary | ICD-10-CM | POA: Diagnosis not present

## 2023-10-16 DIAGNOSIS — Z9221 Personal history of antineoplastic chemotherapy: Secondary | ICD-10-CM | POA: Diagnosis not present

## 2023-10-16 DIAGNOSIS — Z1721 Progesterone receptor positive status: Secondary | ICD-10-CM | POA: Diagnosis not present

## 2023-10-16 DIAGNOSIS — Z510 Encounter for antineoplastic radiation therapy: Secondary | ICD-10-CM | POA: Diagnosis present

## 2023-10-16 DIAGNOSIS — R600 Localized edema: Secondary | ICD-10-CM | POA: Diagnosis not present

## 2023-10-16 DIAGNOSIS — C50412 Malignant neoplasm of upper-outer quadrant of left female breast: Secondary | ICD-10-CM | POA: Insufficient documentation

## 2023-10-16 DIAGNOSIS — Z87891 Personal history of nicotine dependence: Secondary | ICD-10-CM | POA: Diagnosis not present

## 2023-10-17 ENCOUNTER — Encounter: Payer: Self-pay | Admitting: *Deleted

## 2023-10-17 DIAGNOSIS — C50412 Malignant neoplasm of upper-outer quadrant of left female breast: Secondary | ICD-10-CM

## 2023-10-26 ENCOUNTER — Encounter: Payer: Self-pay | Admitting: Hematology and Oncology

## 2023-10-30 ENCOUNTER — Encounter: Payer: Self-pay | Admitting: Adult Health

## 2023-10-30 ENCOUNTER — Other Ambulatory Visit: Payer: Self-pay

## 2023-10-30 ENCOUNTER — Ambulatory Visit
Admission: RE | Admit: 2023-10-30 | Discharge: 2023-10-30 | Disposition: A | Discharge status: Home / Self Care | Source: Ambulatory Visit | Attending: Radiation Oncology | Admitting: Radiation Oncology

## 2023-10-30 ENCOUNTER — Ambulatory Visit
Admission: RE | Admit: 2023-10-30 | Discharge: 2023-10-30 | Disposition: A | Source: Ambulatory Visit | Attending: Radiation Oncology

## 2023-10-30 ENCOUNTER — Inpatient Hospital Stay: Attending: Hematology and Oncology | Admitting: Adult Health

## 2023-10-30 ENCOUNTER — Inpatient Hospital Stay

## 2023-10-30 VITALS — BP 111/71 | HR 71 | Temp 98.4°F | Resp 17 | Ht 62.0 in | Wt 155.3 lb

## 2023-10-30 DIAGNOSIS — M7989 Other specified soft tissue disorders: Secondary | ICD-10-CM | POA: Diagnosis not present

## 2023-10-30 DIAGNOSIS — C50412 Malignant neoplasm of upper-outer quadrant of left female breast: Secondary | ICD-10-CM | POA: Insufficient documentation

## 2023-10-30 DIAGNOSIS — E559 Vitamin D deficiency, unspecified: Secondary | ICD-10-CM

## 2023-10-30 DIAGNOSIS — Z170 Estrogen receptor positive status [ER+]: Secondary | ICD-10-CM

## 2023-10-30 DIAGNOSIS — Z1732 Human epidermal growth factor receptor 2 negative status: Secondary | ICD-10-CM | POA: Insufficient documentation

## 2023-10-30 DIAGNOSIS — R600 Localized edema: Secondary | ICD-10-CM | POA: Insufficient documentation

## 2023-10-30 DIAGNOSIS — Z510 Encounter for antineoplastic radiation therapy: Secondary | ICD-10-CM | POA: Diagnosis not present

## 2023-10-30 DIAGNOSIS — Z9221 Personal history of antineoplastic chemotherapy: Secondary | ICD-10-CM | POA: Insufficient documentation

## 2023-10-30 DIAGNOSIS — Z1721 Progesterone receptor positive status: Secondary | ICD-10-CM | POA: Insufficient documentation

## 2023-10-30 DIAGNOSIS — Z87891 Personal history of nicotine dependence: Secondary | ICD-10-CM | POA: Insufficient documentation

## 2023-10-30 LAB — CMP (CANCER CENTER ONLY)
ALT: 51 U/L — ABNORMAL HIGH (ref 0–44)
AST: 43 U/L — ABNORMAL HIGH (ref 15–41)
Albumin: 4.5 g/dL (ref 3.5–5.0)
Alkaline Phosphatase: 56 U/L (ref 38–126)
Anion gap: 7 (ref 5–15)
BUN: 12 mg/dL (ref 6–20)
CO2: 27 mmol/L (ref 22–32)
Calcium: 10.2 mg/dL (ref 8.9–10.3)
Chloride: 106 mmol/L (ref 98–111)
Creatinine: 0.49 mg/dL (ref 0.44–1.00)
GFR, Estimated: >60 mL/min (ref >60)
Glucose, Bld: 106 mg/dL — ABNORMAL HIGH (ref 70–99)
Potassium: 4.2 mmol/L (ref 3.5–5.1)
Sodium: 140 mmol/L (ref 135–145)
Total Bilirubin: 0.4 mg/dL (ref 0.0–1.2)
Total Protein: 7.4 g/dL (ref 6.5–8.1)

## 2023-10-30 LAB — CBC WITH DIFFERENTIAL (CANCER CENTER ONLY)
Abs Immature Granulocytes: 0.01 K/uL (ref 0.00–0.07)
Basophils Absolute: 0.1 K/uL (ref 0.0–0.1)
Basophils Relative: 1 %
Eosinophils Absolute: 0.2 K/uL (ref 0.0–0.5)
Eosinophils Relative: 3 %
HCT: 35.6 % — ABNORMAL LOW (ref 36.0–46.0)
Hemoglobin: 11.6 g/dL — ABNORMAL LOW (ref 12.0–15.0)
Immature Granulocytes: 0 %
Lymphocytes Relative: 22 %
Lymphs Abs: 1.2 K/uL (ref 0.7–4.0)
MCH: 28.1 pg (ref 26.0–34.0)
MCHC: 32.6 g/dL (ref 30.0–36.0)
MCV: 86.2 fL (ref 80.0–100.0)
Monocytes Absolute: 0.5 K/uL (ref 0.1–1.0)
Monocytes Relative: 10 %
Neutro Abs: 3.4 K/uL (ref 1.7–7.7)
Neutrophils Relative %: 64 %
Platelet Count: 299 K/uL (ref 150–400)
RBC: 4.13 MIL/uL (ref 3.87–5.11)
RDW: 16.6 % — ABNORMAL HIGH (ref 11.5–15.5)
WBC Count: 5.3 K/uL (ref 4.0–10.5)
nRBC: 0 % (ref 0.0–0.2)

## 2023-10-30 LAB — RAD ONC ARIA SESSION SUMMARY
Course Elapsed Days: 0
Plan Fractions Treated to Date: 1
Plan Prescribed Dose Per Fraction: 2.67 Gy
Plan Total Fractions Prescribed: 15
Plan Total Prescribed Dose: 40.05 Gy
Reference Point Dosage Given to Date: 2.67 Gy
Reference Point Session Dosage Given: 2.67 Gy
Session Number: 1

## 2023-10-30 LAB — VITAMIN B12: Vitamin B-12: 2612 pg/mL — ABNORMAL HIGH (ref 180–914)

## 2023-10-30 LAB — BRAIN NATRIURETIC PEPTIDE: B Natriuretic Peptide: 38.8 pg/mL (ref 0.0–100.0)

## 2023-10-30 LAB — VITAMIN D 25 HYDROXY (VIT D DEFICIENCY, FRACTURES): Vit D, 25-Hydroxy: 22.6 ng/mL — ABNORMAL LOW (ref 30–100)

## 2023-10-30 LAB — HEMOGLOBIN A1C
Hgb A1c MFr Bld: 5.3 % (ref 4.8–5.6)
Mean Plasma Glucose: 105.41 mg/dL

## 2023-10-30 MED ORDER — ALRA NON-METALLIC DEODORANT (RAD-ONC)
1.0000 | Freq: Once | TOPICAL | Status: AC
  Administered 2023-10-30: 1 via TOPICAL

## 2023-10-30 MED ORDER — FUROSEMIDE 20 MG PO TABS: 20.0000 mg | ORAL_TABLET | Freq: Every day | ORAL | 0 refills | Status: AC | PRN

## 2023-10-30 MED ORDER — RADIAPLEXRX EX GEL: Freq: Once | CUTANEOUS | Status: AC

## 2023-10-30 NOTE — Progress Notes (Signed)
 [facility] Cancer Follow up:    Jolee Madelin Patch, Stacy [name] [address] [place] [address]   DIAGNOSIS:  Cancer Staging  No matching staging information was found for the patient.    SUMMARY OF ONCOLOGIC HISTORY: Oncology History  Malignant neoplasm of upper-outer quadrant of left breast in female, estrogen receptor positive (HCC)  04/26/2023 Initial Diagnosis   Mammogram detected left breast mass measuring 1.4 cm, ATM gene mutation, biopsy grade 1 invasive lobular cancer with LCIS ER 10%, PR 1%, HER2 negative, Ki-67 3%   06/06/2023 Surgery   Left lumpectomy: 2.3 cm grade 2 ILC margins -0/1 sentinel lymph node, ER 21 to 30% weak, PR 70 to 80% moderate, HER2 1+ negative, Ki-67 40%    Oncotype testing   Oncotype DX recurrence score 27   07/26/2023 - 09/29/2023 Chemotherapy   Patient is on Treatment Plan : BREAST TC q21d       CURRENT THERAPY: adjuvant radiation therapy  INTERVAL HISTORY:  Discussed the use of AI scribe software for clinical note transcription with the patient, who gave verbal consent to proceed.  History of Present Illness Stacy [name] is a 43 year old female with left breast invasive lobular carcinoma who presents with leg swelling.  She experiences bilateral leg swelling, which was more pronounced a few days ago but has since improved. The swelling causes discomfort, particularly when sitting or writing, as it extends up the legs. No recent weight gain is noted, although she did gain weight during chemotherapy, which has now stabilized.  She completed adjuvant chemotherapy with Taxotere  and Cytoxan  on September 27, 2023, and is starting adjuvant radiation therapy today. She has a history of gestational diabetes and an ATM mutation. Her weight has increased slightly from 148 to 155 pounds since August.  She is cautious about sodium intake, especially when dining out, and primarily eats at home. She suspects inadequate  fluid intake. Her vitamin D  level has not been checked recently, and she is unsure about her impaired fasting glucose status, as previous tests were conducted after breakfast.     Patient Active Problem List   Diagnosis Date Noted   Port-A-Cath in place 07/25/2023   Malignant neoplasm of upper-outer quadrant of left breast in female, estrogen receptor positive (HCC) 07/11/2023   Genital herpes 03/14/2019   Gestational diabetes mellitus (GDM) in third trimester 06/21/2018   Gestational diabetes mellitus (GDM) controlled on oral hypoglycemic drug, antepartum 01/11/2018   Supervision of high risk pregnancy, antepartum 11/02/2017   AMA (advanced maternal age) multigravida 35+ 11/02/2017    has no known allergies.  MEDICAL HISTORY: Past Medical History:  Diagnosis Date   Gestational diabetes    glyburide     SURGICAL HISTORY: Past Surgical History:  Procedure Laterality Date   BREAST LUMPECTOMY Left    IR IMAGING GUIDED PORT INSERTION  07/20/2023   MULTIPLE TOOTH EXTRACTIONS Bilateral    for orthodontic care    SOCIAL HISTORY: Social History   Socioeconomic History   Marital status: Married    Spouse name: Not on file   Number of children: Not on file   Years of education: Not on file   Highest education level: Not on file  Occupational History   Not on file  Tobacco Use   Smoking status: Former    Current packs/day: 0.00    Types: Cigarettes    Quit date: 06/28/2001    Years since quitting: 22.3   Smokeless tobacco: Never  Vaping  Use   Vaping status: Never Used  Substance and Sexual Activity   Alcohol use: Not Currently    Alcohol/week: 0.0 standard drinks of alcohol    Comment: Ocassionally   Drug use: No   Sexual activity: Yes    Partners: Male    Birth control/protection: Other-see comments    Comment: husband has vasectomy  Other Topics Concern   Not on file  Social History Narrative   Not on file   Social Drivers of Health   Financial Resource  Strain: Low Risk  (06/07/2018)   Overall Financial Resource Strain (CARDIA)    Difficulty of Paying Living Expenses: Not hard at all  Food Insecurity: No Food Insecurity (09/20/2023)   Hunger Vital Sign    Worried About Running Out of Food in the Last Year: Never true    Ran Out of Food in the Last Year: Never true  Transportation Needs: No Transportation Needs (09/20/2023)   PRAPARE - Administrator, Civil Service (Medical): No    Lack of Transportation (Non-Medical): No  Physical Activity: Not on file  Stress: No Stress Concern Present (06/07/2018)   [employer] of Occupational Health - Occupational Stress Questionnaire    Feeling of Stress : Only a little  Social Connections: Not on file  Intimate Partner Violence: Not At Risk (09/20/2023)   Humiliation, Afraid, Rape, and Kick questionnaire    Fear of Current or Ex-Partner: No    Emotionally Abused: No    Physically Abused: No    Sexually Abused: No    FAMILY HISTORY: Family History  Problem Relation Age of Onset   Diabetes Mother    Hyperlipidemia Mother    Diabetes Father    Cancer Neg Hx     Review of Systems  Constitutional:  Negative for appetite change, chills, fatigue, fever and unexpected weight change.  HENT:   Negative for hearing loss, lump/mass and trouble swallowing.   Eyes:  Negative for eye problems and icterus.  Respiratory:  Negative for chest tightness, cough and shortness of breath.   Cardiovascular:  Negative for chest pain, leg swelling and palpitations.  Gastrointestinal:  Negative for abdominal distention, abdominal pain, constipation, diarrhea, nausea and vomiting.  Endocrine: Negative for hot flashes.  Genitourinary:  Negative for difficulty urinating.   Musculoskeletal:  Negative for arthralgias.  Skin:  Negative for itching and rash.  Neurological:  Negative for dizziness, extremity weakness, headaches and numbness.  Hematological:  Negative for adenopathy. Does not bruise/bleed  easily.  Psychiatric/Behavioral:  Negative for depression. The patient is not nervous/anxious.       PHYSICAL EXAMINATION   Onc Performance Status - 10/30/23 0958       ECOG Perf Status   ECOG Perf Status Restricted in physically strenuous activity but ambulatory and able to carry out work of a light or sedentary nature, e.g., light house work, office work (P)       KPS SCALE   KPS % SCORE Able to carry on normal activity, minor s/s of disease (P)           Vitals:   10/30/23 0955  BP: 111/71  Pulse: 71  Resp: 17  Temp: 98.4 F (36.9 C)  SpO2: 100%    Physical Exam Constitutional:      General: She is not in acute distress.    Appearance: Normal appearance. She is not toxic-appearing.  HENT:     Head: Normocephalic and atraumatic.     Mouth/Throat:  Mouth: Mucous membranes are moist.     Pharynx: Oropharynx is clear. No oropharyngeal exudate or posterior oropharyngeal erythema.  Eyes:     General: No scleral icterus. Cardiovascular:     Rate and Rhythm: Normal rate and regular rhythm.     Pulses: Normal pulses.     Heart sounds: Normal heart sounds.  Pulmonary:     Effort: Pulmonary effort is normal.     Breath sounds: Normal breath sounds.  Abdominal:     General: Abdomen is flat. Bowel sounds are normal. There is no distension.     Palpations: Abdomen is soft.     Tenderness: There is no abdominal tenderness.  Musculoskeletal:        General: Swelling present.     Cervical back: Neck supple.  Lymphadenopathy:     Cervical: No cervical adenopathy.  Skin:    General: Skin is warm and dry.     Findings: No rash.  Neurological:     General: No focal deficit present.     Mental Status: She is alert.  Psychiatric:        Mood and Affect: Mood normal.        Behavior: Behavior normal.     LABORATORY DATA:  CBC    Component Value Date/Time   WBC 5.4 09/27/2023 1016   WBC 10.9 (H) 06/22/2018 0603   RBC 3.56 (L) 09/27/2023 1016   HGB 10.0 (L)  09/27/2023 1016   HGB 11.6 03/27/2018 1233   HCT 30.0 (L) 09/27/2023 1016   HCT 35.7 03/27/2018 1233   PLT 293 09/27/2023 1016   PLT 312 03/27/2018 1233   MCV 84.3 09/27/2023 1016   MCV 86 03/27/2018 1233   MCH 28.1 09/27/2023 1016   MCHC 33.3 09/27/2023 1016   RDW 17.7 (H) 09/27/2023 1016   RDW 13.4 03/27/2018 1233   LYMPHSABS 1.0 09/27/2023 1016   LYMPHSABS 1.6 12/06/2017 1001   MONOABS 0.5 09/27/2023 1016   EOSABS 0.0 09/27/2023 1016   EOSABS 0.1 12/06/2017 1001   BASOSABS 0.0 09/27/2023 1016   BASOSABS 0.0 12/06/2017 1001    CMP     Component Value Date/Time   NA 139 09/27/2023 1016   K 3.9 09/27/2023 1016   CL 107 09/27/2023 1016   CO2 27 09/27/2023 1016   GLUCOSE 137 (H) 09/27/2023 1016   BUN 17 09/27/2023 1016   CREATININE 0.53 09/27/2023 1016   CALCIUM 8.9 09/27/2023 1016   PROT 6.4 (L) 09/27/2023 1016   ALBUMIN 4.0 09/27/2023 1016   AST 24 09/27/2023 1016   ALT 42 09/27/2023 1016   ALKPHOS 48 09/27/2023 1016   BILITOT 0.3 09/27/2023 1016   GFRNONAA >60 09/27/2023 1016     ASSESSMENT and THERAPY PLAN:   No problem-specific Assessment & Plan notes found for this encounter. Assessment and Plan Assessment & Plan Left breast invasive lobular carcinoma Status post left lumpectomy and adjuvant chemotherapy with Taxotere  and Cytoxan , completed on September 27, 2023. Currently undergoing adjuvant radiation therapy.  Bilateral lower extremity edema Bilateral lower extremity edema with improvement. Weight gain possibly due to fluid retention. Sodium intake and recent chemotherapy with Taxotere  discussed as contributing factors. ATM mutation and impaired fasting glucose noted.  - Order labs to assess potential causes of edema, including wellness labs and vitamin D  level. - Prescribe short course of Lasix to manage fluid retention, limit use to no more than two consecutive days. - Follow up on lab results to optimize treatment if necessary.  RTC on  11/21/2023 for  f/u with Dr. Gudena.      All questions were answered. The patient knows to call the clinic with any problems, questions or concerns. We can certainly see the patient much sooner if necessary.  Total encounter time:20 minutes*in face-to-face visit time, chart review, lab review, care coordination, order entry, and documentation of the encounter time.    Morna Kendall, NP 10/30/23 10:15 AM Medical Oncology and H[facility]ology [facility] [address] [place], [place] [address] Tel. [phone]    Fax. [phone]  *Total Encounter Time as defined by the Centers for Medicare and Medicaid Services includes, in addition to the face-to-face time of a patient visit (documented in the note above) non-face-to-face time: obtaining and reviewing outside history, ordering and reviewing medications, tests or procedures, care coordination (communications with other health care professionals or caregivers) and documentation in the medical record.

## 2023-10-31 ENCOUNTER — Other Ambulatory Visit: Payer: Self-pay

## 2023-10-31 ENCOUNTER — Ambulatory Visit
Admission: RE | Admit: 2023-10-31 | Discharge: 2023-10-31 | Disposition: A | Source: Ambulatory Visit | Attending: Radiation Oncology

## 2023-10-31 ENCOUNTER — Ambulatory Visit: Payer: Self-pay

## 2023-10-31 DIAGNOSIS — Z510 Encounter for antineoplastic radiation therapy: Secondary | ICD-10-CM | POA: Diagnosis not present

## 2023-10-31 LAB — RAD ONC ARIA SESSION SUMMARY
Course Elapsed Days: 1
Plan Fractions Treated to Date: 2
Plan Prescribed Dose Per Fraction: 2.67 Gy
Plan Total Fractions Prescribed: 15
Plan Total Prescribed Dose: 40.05 Gy
Reference Point Dosage Given to Date: 5.34 Gy
Reference Point Session Dosage Given: 2.67 Gy
Session Number: 2

## 2023-10-31 NOTE — Telephone Encounter (Addendum)
 Patient Call Summary - NP Questions  Vitamin B12: Not currently taking  Vitamin D3: Unsure of calcium dosage; taking half a dose  Multivitamin: Not currently taking  Tylenol : Not currently taking  Alcohol Use: No alcohol with medications; patient reported having 3 glasses of wine the previous weekend   --- Message from Morna JAYSON Kendall sent at 10/31/2023  8:35 AM EDT ----- Please ask the patient the following questions.B12 level is elevated and d level is low. Are you taking vitamin b12, if so, how much and how often? Are you taking any Vitamin D  supplementation, if so, how much and how often? Are you taking a multivitamin? Her liver enzymes are elevated slightly.  Please ask whether she is taking tylenol , if so how much/often, and whether she drinks any ETOH.  I recommend avoiding tylenol /ETOH and repeating labs with  CMP prior to appt with Dr. Gudena.  If patient is agreeable, please place orders for repeat CMP and send schedule message for labs pre [facility]G appt in November.    Thanks, LC ----- Message ----- From: Interface, Lab In [place] Sent: 10/30/2023  11:04 AM EDT To: Morna Dalton Kendall, NP

## 2023-11-01 ENCOUNTER — Ambulatory Visit
Admission: RE | Admit: 2023-11-01 | Discharge: 2023-11-01 | Disposition: A | Source: Ambulatory Visit | Attending: Radiation Oncology

## 2023-11-01 ENCOUNTER — Other Ambulatory Visit: Payer: Self-pay

## 2023-11-01 DIAGNOSIS — Z510 Encounter for antineoplastic radiation therapy: Secondary | ICD-10-CM | POA: Diagnosis not present

## 2023-11-01 LAB — RAD ONC ARIA SESSION SUMMARY
Course Elapsed Days: 2
Plan Fractions Treated to Date: 3
Plan Prescribed Dose Per Fraction: 2.67 Gy
Plan Total Fractions Prescribed: 15
Plan Total Prescribed Dose: 40.05 Gy
Reference Point Dosage Given to Date: 8.01 Gy
Reference Point Session Dosage Given: 2.67 Gy
Session Number: 3

## 2023-11-02 ENCOUNTER — Other Ambulatory Visit: Payer: Self-pay

## 2023-11-02 ENCOUNTER — Ambulatory Visit
Admission: RE | Admit: 2023-11-02 | Discharge: 2023-11-02 | Disposition: A | Source: Ambulatory Visit | Attending: Radiation Oncology

## 2023-11-02 DIAGNOSIS — Z510 Encounter for antineoplastic radiation therapy: Secondary | ICD-10-CM | POA: Diagnosis not present

## 2023-11-02 DIAGNOSIS — C50412 Malignant neoplasm of upper-outer quadrant of left female breast: Secondary | ICD-10-CM

## 2023-11-02 LAB — RAD ONC ARIA SESSION SUMMARY
Course Elapsed Days: 3
Plan Fractions Treated to Date: 4
Plan Prescribed Dose Per Fraction: 2.67 Gy
Plan Total Fractions Prescribed: 15
Plan Total Prescribed Dose: 40.05 Gy
Reference Point Dosage Given to Date: 10.68 Gy
Reference Point Session Dosage Given: 2.67 Gy
Session Number: 4

## 2023-11-03 ENCOUNTER — Ambulatory Visit
Admission: RE | Admit: 2023-11-03 | Discharge: 2023-11-03 | Disposition: A | Discharge status: Home / Self Care | Source: Ambulatory Visit | Attending: Radiation Oncology | Admitting: Radiation Oncology

## 2023-11-03 ENCOUNTER — Other Ambulatory Visit: Payer: Self-pay

## 2023-11-03 DIAGNOSIS — Z510 Encounter for antineoplastic radiation therapy: Secondary | ICD-10-CM | POA: Diagnosis not present

## 2023-11-03 LAB — RAD ONC ARIA SESSION SUMMARY
Course Elapsed Days: 4
Plan Fractions Treated to Date: 5
Plan Prescribed Dose Per Fraction: 2.67 Gy
Plan Total Fractions Prescribed: 15
Plan Total Prescribed Dose: 40.05 Gy
Reference Point Dosage Given to Date: 13.35 Gy
Reference Point Session Dosage Given: 2.67 Gy
Session Number: 5

## 2023-11-06 ENCOUNTER — Encounter: Payer: Self-pay | Admitting: Hematology and Oncology

## 2023-11-06 ENCOUNTER — Ambulatory Visit
Admission: RE | Admit: 2023-11-06 | Discharge: 2023-11-06 | Disposition: A | Discharge status: Home / Self Care | Source: Ambulatory Visit | Attending: Radiation Oncology | Admitting: Radiation Oncology

## 2023-11-06 ENCOUNTER — Other Ambulatory Visit: Payer: Self-pay

## 2023-11-06 DIAGNOSIS — Z510 Encounter for antineoplastic radiation therapy: Secondary | ICD-10-CM | POA: Diagnosis not present

## 2023-11-06 LAB — RAD ONC ARIA SESSION SUMMARY
Course Elapsed Days: 7
Plan Fractions Treated to Date: 6
Plan Prescribed Dose Per Fraction: 2.67 Gy
Plan Total Fractions Prescribed: 15
Plan Total Prescribed Dose: 40.05 Gy
Reference Point Dosage Given to Date: 16.02 Gy
Reference Point Session Dosage Given: 2.67 Gy
Session Number: 6

## 2023-11-07 ENCOUNTER — Ambulatory Visit
Admission: RE | Admit: 2023-11-07 | Discharge: 2023-11-07 | Disposition: A | Discharge status: Home / Self Care | Source: Ambulatory Visit | Attending: Radiation Oncology | Admitting: Radiation Oncology

## 2023-11-07 ENCOUNTER — Other Ambulatory Visit: Payer: Self-pay | Admitting: *Deleted

## 2023-11-07 ENCOUNTER — Other Ambulatory Visit: Payer: Self-pay

## 2023-11-07 DIAGNOSIS — Z510 Encounter for antineoplastic radiation therapy: Secondary | ICD-10-CM | POA: Diagnosis not present

## 2023-11-07 DIAGNOSIS — Z95828 Presence of other vascular implants and grafts: Secondary | ICD-10-CM

## 2023-11-07 LAB — RAD ONC ARIA SESSION SUMMARY
Course Elapsed Days: 8
Plan Fractions Treated to Date: 7
Plan Prescribed Dose Per Fraction: 2.67 Gy
Plan Total Fractions Prescribed: 15
Plan Total Prescribed Dose: 40.05 Gy
Reference Point Dosage Given to Date: 18.69 Gy
Reference Point Session Dosage Given: 2.67 Gy
Session Number: 7

## 2023-11-07 NOTE — Progress Notes (Signed)
 Per MD request, orders placed for port a cath removal with IR. Pt notified and verbalized understanding.

## 2023-11-08 ENCOUNTER — Ambulatory Visit
Admission: RE | Admit: 2023-11-08 | Discharge: 2023-11-08 | Disposition: A | Discharge status: Home / Self Care | Source: Ambulatory Visit | Attending: Radiation Oncology | Admitting: Radiation Oncology

## 2023-11-08 ENCOUNTER — Other Ambulatory Visit: Payer: Self-pay

## 2023-11-08 DIAGNOSIS — Z510 Encounter for antineoplastic radiation therapy: Secondary | ICD-10-CM | POA: Diagnosis not present

## 2023-11-08 LAB — RAD ONC ARIA SESSION SUMMARY
Course Elapsed Days: 9
Plan Fractions Treated to Date: 8
Plan Prescribed Dose Per Fraction: 2.67 Gy
Plan Total Fractions Prescribed: 15
Plan Total Prescribed Dose: 40.05 Gy
Reference Point Dosage Given to Date: 21.36 Gy
Reference Point Session Dosage Given: 2.67 Gy
Session Number: 8

## 2023-11-09 ENCOUNTER — Ambulatory Visit

## 2023-11-10 ENCOUNTER — Ambulatory Visit
Admission: RE | Admit: 2023-11-10 | Discharge: 2023-11-10 | Disposition: A | Discharge status: Home / Self Care | Source: Ambulatory Visit | Attending: Radiation Oncology | Admitting: Radiation Oncology

## 2023-11-10 ENCOUNTER — Other Ambulatory Visit: Payer: Self-pay

## 2023-11-10 DIAGNOSIS — Z510 Encounter for antineoplastic radiation therapy: Secondary | ICD-10-CM | POA: Diagnosis not present

## 2023-11-10 LAB — RAD ONC ARIA SESSION SUMMARY
Course Elapsed Days: 11
Plan Fractions Treated to Date: 9
Plan Prescribed Dose Per Fraction: 2.67 Gy
Plan Total Fractions Prescribed: 15
Plan Total Prescribed Dose: 40.05 Gy
Reference Point Dosage Given to Date: 24.03 Gy
Reference Point Session Dosage Given: 2.67 Gy
Session Number: 9

## 2023-11-13 ENCOUNTER — Other Ambulatory Visit: Payer: Self-pay

## 2023-11-13 ENCOUNTER — Ambulatory Visit: Admitting: Radiation Oncology

## 2023-11-13 ENCOUNTER — Ambulatory Visit
Admission: RE | Admit: 2023-11-13 | Discharge: 2023-11-13 | Disposition: A | Discharge status: Home / Self Care | Source: Ambulatory Visit | Attending: Radiation Oncology | Admitting: Radiation Oncology

## 2023-11-13 DIAGNOSIS — I890 Lymphedema, not elsewhere classified: Secondary | ICD-10-CM | POA: Diagnosis not present

## 2023-11-13 DIAGNOSIS — Z1732 Human epidermal growth factor receptor 2 negative status: Secondary | ICD-10-CM | POA: Diagnosis not present

## 2023-11-13 DIAGNOSIS — N951 Menopausal and female climacteric states: Secondary | ICD-10-CM | POA: Diagnosis not present

## 2023-11-13 DIAGNOSIS — Z170 Estrogen receptor positive status [ER+]: Secondary | ICD-10-CM | POA: Diagnosis not present

## 2023-11-13 DIAGNOSIS — R350 Frequency of micturition: Secondary | ICD-10-CM | POA: Diagnosis not present

## 2023-11-13 DIAGNOSIS — C50412 Malignant neoplasm of upper-outer quadrant of left female breast: Secondary | ICD-10-CM | POA: Insufficient documentation

## 2023-11-13 DIAGNOSIS — Z1721 Progesterone receptor positive status: Secondary | ICD-10-CM | POA: Diagnosis not present

## 2023-11-13 DIAGNOSIS — R232 Flushing: Secondary | ICD-10-CM | POA: Diagnosis not present

## 2023-11-13 DIAGNOSIS — Z5111 Encounter for antineoplastic chemotherapy: Secondary | ICD-10-CM | POA: Diagnosis present

## 2023-11-13 LAB — RAD ONC ARIA SESSION SUMMARY
Course Elapsed Days: 14
Plan Fractions Treated to Date: 10
Plan Prescribed Dose Per Fraction: 2.67 Gy
Plan Total Fractions Prescribed: 15
Plan Total Prescribed Dose: 40.05 Gy
Reference Point Dosage Given to Date: 26.7 Gy
Reference Point Session Dosage Given: 2.67 Gy
Session Number: 10

## 2023-11-14 ENCOUNTER — Ambulatory Visit
Admission: RE | Admit: 2023-11-14 | Discharge: 2023-11-14 | Disposition: A | Discharge status: Home / Self Care | Source: Ambulatory Visit | Attending: Radiation Oncology | Admitting: Radiation Oncology

## 2023-11-14 ENCOUNTER — Other Ambulatory Visit: Payer: Self-pay

## 2023-11-14 DIAGNOSIS — Z5111 Encounter for antineoplastic chemotherapy: Secondary | ICD-10-CM | POA: Diagnosis not present

## 2023-11-14 LAB — RAD ONC ARIA SESSION SUMMARY
Course Elapsed Days: 15
Plan Fractions Treated to Date: 11
Plan Prescribed Dose Per Fraction: 2.67 Gy
Plan Total Fractions Prescribed: 15
Plan Total Prescribed Dose: 40.05 Gy
Reference Point Dosage Given to Date: 29.37 Gy
Reference Point Session Dosage Given: 2.67 Gy
Session Number: 11

## 2023-11-15 ENCOUNTER — Other Ambulatory Visit: Payer: Self-pay

## 2023-11-15 ENCOUNTER — Ambulatory Visit
Admission: RE | Admit: 2023-11-15 | Discharge: 2023-11-15 | Disposition: A | Discharge status: Home / Self Care | Source: Ambulatory Visit | Attending: Radiation Oncology | Admitting: Radiation Oncology

## 2023-11-15 DIAGNOSIS — Z5111 Encounter for antineoplastic chemotherapy: Secondary | ICD-10-CM | POA: Diagnosis not present

## 2023-11-15 LAB — RAD ONC ARIA SESSION SUMMARY
Course Elapsed Days: 16
Plan Fractions Treated to Date: 12
Plan Prescribed Dose Per Fraction: 2.67 Gy
Plan Total Fractions Prescribed: 15
Plan Total Prescribed Dose: 40.05 Gy
Reference Point Dosage Given to Date: 32.04 Gy
Reference Point Session Dosage Given: 2.67 Gy
Session Number: 12

## 2023-11-16 ENCOUNTER — Other Ambulatory Visit: Payer: Self-pay

## 2023-11-16 ENCOUNTER — Ambulatory Visit
Admission: RE | Admit: 2023-11-16 | Discharge: 2023-11-16 | Disposition: A | Discharge status: Home / Self Care | Source: Ambulatory Visit | Attending: Radiation Oncology | Admitting: Radiation Oncology

## 2023-11-16 DIAGNOSIS — Z5111 Encounter for antineoplastic chemotherapy: Secondary | ICD-10-CM | POA: Diagnosis not present

## 2023-11-16 LAB — RAD ONC ARIA SESSION SUMMARY
Course Elapsed Days: 17
Plan Fractions Treated to Date: 13
Plan Prescribed Dose Per Fraction: 2.67 Gy
Plan Total Fractions Prescribed: 15
Plan Total Prescribed Dose: 40.05 Gy
Reference Point Dosage Given to Date: 34.71 Gy
Reference Point Session Dosage Given: 2.67 Gy
Session Number: 13

## 2023-11-17 ENCOUNTER — Other Ambulatory Visit: Payer: Self-pay

## 2023-11-17 ENCOUNTER — Ambulatory Visit
Admission: RE | Admit: 2023-11-17 | Discharge: 2023-11-17 | Disposition: A | Discharge status: Home / Self Care | Source: Ambulatory Visit | Attending: Radiation Oncology | Admitting: Radiation Oncology

## 2023-11-17 DIAGNOSIS — Z5111 Encounter for antineoplastic chemotherapy: Secondary | ICD-10-CM | POA: Diagnosis not present

## 2023-11-17 LAB — RAD ONC ARIA SESSION SUMMARY
Course Elapsed Days: 18
Plan Fractions Treated to Date: 14
Plan Prescribed Dose Per Fraction: 2.67 Gy
Plan Total Fractions Prescribed: 15
Plan Total Prescribed Dose: 40.05 Gy
Reference Point Dosage Given to Date: 37.38 Gy
Reference Point Session Dosage Given: 2.67 Gy
Session Number: 14

## 2023-11-20 ENCOUNTER — Ambulatory Visit

## 2023-11-20 ENCOUNTER — Other Ambulatory Visit: Payer: Self-pay

## 2023-11-20 ENCOUNTER — Ambulatory Visit
Admission: RE | Admit: 2023-11-20 | Discharge: 2023-11-20 | Disposition: A | Discharge status: Home / Self Care | Source: Ambulatory Visit | Attending: Radiation Oncology | Admitting: Radiation Oncology

## 2023-11-20 DIAGNOSIS — Z5111 Encounter for antineoplastic chemotherapy: Secondary | ICD-10-CM | POA: Diagnosis not present

## 2023-11-20 DIAGNOSIS — C50412 Malignant neoplasm of upper-outer quadrant of left female breast: Secondary | ICD-10-CM

## 2023-11-20 LAB — RAD ONC ARIA SESSION SUMMARY
Course Elapsed Days: 21
Plan Fractions Treated to Date: 15
Plan Prescribed Dose Per Fraction: 2.67 Gy
Plan Total Fractions Prescribed: 15
Plan Total Prescribed Dose: 40.05 Gy
Reference Point Dosage Given to Date: 40.05 Gy
Reference Point Session Dosage Given: 2.67 Gy
Session Number: 15

## 2023-11-20 MED ORDER — RADIAPLEXRX EX GEL: Freq: Once | CUTANEOUS | Status: AC

## 2023-11-20 NOTE — Assessment & Plan Note (Signed)
 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    Treatment plan: Adjuvant chemotherapy with Taxotere  and Cytoxan  every 3 weeks x 4 started 07/26/2023-09/27/2023 adjuvant radiation therapy 10/31/2023-11/27/2023 Adjuvant antiestrogen therapy -------------------------------------------------------------------------------------------------------------------------------------- Treatment plan:  Oral antiestrogen therapy with tamoxifen versus ovarian function suppression and anastrozole we discussed  Patient previously lost a lot of weight with Tirzapatide.   Return to clinic after radiation is complete We briefly discussed about guardant reveal for MRD testing

## 2023-11-21 ENCOUNTER — Ambulatory Visit

## 2023-11-21 ENCOUNTER — Inpatient Hospital Stay: Admitting: Hematology and Oncology

## 2023-11-21 ENCOUNTER — Inpatient Hospital Stay: Attending: Hematology and Oncology | Admitting: Hematology and Oncology

## 2023-11-21 ENCOUNTER — Other Ambulatory Visit: Payer: Self-pay

## 2023-11-21 VITALS — BP 99/67 | HR 85 | Temp 98.1°F | Ht 62.0 in | Wt 150.5 lb

## 2023-11-21 DIAGNOSIS — R350 Frequency of micturition: Secondary | ICD-10-CM | POA: Insufficient documentation

## 2023-11-21 DIAGNOSIS — N951 Menopausal and female climacteric states: Secondary | ICD-10-CM | POA: Insufficient documentation

## 2023-11-21 DIAGNOSIS — R232 Flushing: Secondary | ICD-10-CM | POA: Insufficient documentation

## 2023-11-21 DIAGNOSIS — Z170 Estrogen receptor positive status [ER+]: Secondary | ICD-10-CM | POA: Insufficient documentation

## 2023-11-21 DIAGNOSIS — C50412 Malignant neoplasm of upper-outer quadrant of left female breast: Secondary | ICD-10-CM | POA: Diagnosis not present

## 2023-11-21 DIAGNOSIS — Z5111 Encounter for antineoplastic chemotherapy: Secondary | ICD-10-CM | POA: Insufficient documentation

## 2023-11-21 DIAGNOSIS — I890 Lymphedema, not elsewhere classified: Secondary | ICD-10-CM | POA: Insufficient documentation

## 2023-11-21 DIAGNOSIS — Z1721 Progesterone receptor positive status: Secondary | ICD-10-CM | POA: Insufficient documentation

## 2023-11-21 DIAGNOSIS — Z1732 Human epidermal growth factor receptor 2 negative status: Secondary | ICD-10-CM | POA: Insufficient documentation

## 2023-11-21 LAB — RAD ONC ARIA SESSION SUMMARY
Course Elapsed Days: 22
Plan Fractions Treated to Date: 1
Plan Prescribed Dose Per Fraction: 2 Gy
Plan Total Fractions Prescribed: 5
Plan Total Prescribed Dose: 10 Gy
Reference Point Dosage Given to Date: 2 Gy
Reference Point Session Dosage Given: 2 Gy
Session Number: 16

## 2023-11-21 MED ORDER — ANASTROZOLE 1 MG PO TABS: 1.0000 mg | ORAL_TABLET | Freq: Every day | ORAL | 3 refills | Status: AC

## 2023-11-21 NOTE — Progress Notes (Signed)
 Patient Care Team: Jolee Madelin Patch, MD as PCP - General (Family Medicine) Ebbie Cough, MD as Consulting Physician (General Surgery) Odean Potts, MD as Consulting Physician (Hematology and Oncology) Gerome, Devere HERO, RN as Oncology Nurse Navigator Tyree Nanetta SAILOR, RN as Oncology Nurse Navigator  DIAGNOSIS:  Encounter Diagnosis  Name Primary?   Malignant neoplasm of upper-outer quadrant of left breast in female, estrogen receptor positive (HCC) Yes    SUMMARY OF ONCOLOGIC HISTORY: Oncology History  Malignant neoplasm of upper-outer quadrant of left breast in female, estrogen receptor positive (HCC)  04/26/2023 Initial Diagnosis   Mammogram detected left breast mass measuring 1.4 cm, ATM gene mutation, biopsy grade 1 invasive lobular cancer with LCIS ER 10%, PR 1%, HER2 negative, Ki-67 3%   06/06/2023 Surgery   Left lumpectomy: 2.3 cm grade 2 ILC margins -0/1 sentinel lymph node, ER 21 to 30% weak, PR 70 to 80% moderate, HER2 1+ negative, Ki-67 40%    Oncotype testing   Oncotype DX recurrence score 27   07/26/2023 - 09/29/2023 Chemotherapy   Patient is on Treatment Plan : BREAST TC q21d       CHIEF COMPLIANT: Follow-up after radiation to discuss antiestrogen treatment plan  HISTORY OF PRESENT ILLNESS: History of Present Illness Stacy [name] is a 43 year old female with breast cancer who presents for follow-up regarding her ongoing treatment and side effects.  She is nearing the completion of her radiation therapy and has experienced some pain and difficulty grasping objects due to lymphedema, which has improved since her last chemotherapy session. The swelling has significantly reduced, and she no longer requires medication for it.  She is considering antiestrogen treatment options post-radiation therapy and is concerned about potential menopause-like symptoms, including hot flashes, mood swings, and joint stiffness. She has experienced mild hot flashes  during chemotherapy. Concerns about bone density are present, and she is taking calcium and vitamin D  supplements while engaging in weight-bearing exercises.  She inquires about the impact of treatment on her sexual health, noting concerns about vaginal dryness and changes in sex drive. Her interest in sexual activity decreased during chemotherapy, with hopes for improvement post-treatment.     ALLERGIES:  has no known allergies.  MEDICATIONS:  Current Outpatient Medications  Medication Sig Dispense Refill   anastrozole (ARIMIDEX) 1 MG tablet Take 1 tablet (1 mg total) by mouth daily. 90 tablet 3   furosemide (LASIX) 20 MG tablet Take 1 tablet (20 mg total) by mouth daily as needed. Do not take more than 2 days in  row 10 tablet 0   Magnesium Oxide -Mg Supplement 400 MG CAPS      tirzepatide (ZEPBOUND) 7.5 MG/0.5ML Pen Inject 7.5 mg into the skin.     Ashwagandha (FT ASHWAGANDHA EXTRACT) 500 MG CAPS  (Patient not taking: Reported on 11/21/2023)     No current facility-administered medications for this visit.    PHYSICAL EXAMINATION: ECOG PERFORMANCE STATUS: 1 - Symptomatic but completely ambulatory  Vitals:   11/21/23 0911  BP: 99/67  Pulse: 85  Temp: 98.1 F (36.7 C)  SpO2: 100%   Filed Weights   11/21/23 0911  Weight: 150 lb 8 oz (68.3 kg)    Physical Exam   (exam performed in the presence of a chaperone)  LABORATORY DATA:  I have reviewed the data as listed    Latest Ref Rng & Units 10/30/2023   10:42 AM 09/27/2023   10:16 AM 09/05/2023   11:16 AM  CMP  Glucose 70 -  99 mg/dL 893  862  860   BUN 6 - 20 mg/dL 12  17  14    Creatinine 0.44 - 1.00 mg/dL 9.50  9.46  9.56   Sodium 135 - 145 mmol/L 140  139  141   Potassium 3.5 - 5.1 mmol/L 4.2  3.9  3.8   Chloride 98 - 111 mmol/L 106  107  108   CO2 22 - 32 mmol/L 27  27  27    Calcium 8.9 - 10.3 mg/dL 89.7  8.9  9.4   Total Protein 6.5 - 8.1 g/dL 7.4  6.4  6.7   Total Bilirubin 0.0 - 1.2 mg/dL 0.4  0.3  0.3    Alkaline Phos 38 - 126 U/L 56  48  48   AST 15 - 41 U/L 43  24  33   ALT 0 - 44 U/L 51  42  76     Lab Results  Component Value Date   WBC 5.3 10/30/2023   HGB 11.6 (L) 10/30/2023   HCT 35.6 (L) 10/30/2023   MCV 86.2 10/30/2023   PLT 299 10/30/2023   NEUTROABS 3.4 10/30/2023    ASSESSMENT & PLAN:  Malignant neoplasm of upper-outer quadrant of left breast in female, estrogen receptor positive (HCC) 06/06/2023: Left lumpectomy: 2.3 cm grade 2 ILC margins -0/1 sentinel lymph node, ER 21 to 30% weak, PR 70 to 80% moderate, HER2 1+ negative, Ki-67 40% Oncotype DX recurrence score 27: High risk ATM gene mutation: 30% lifetime risk of breast cancer 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    Treatment plan: Adjuvant chemotherapy with Taxotere  and Cytoxan  every 3 weeks x 4 started 07/26/2023-09/27/2023 adjuvant radiation therapy 10/31/2023-11/27/2023 Adjuvant antiestrogen therapy -------------------------------------------------------------------------------------------------------------------------------------- Treatment plan:  Oral antiestrogen therapy with tamoxifen versus ovarian function suppression and anastrozole we discussed After much discussion we decided on proceeding with ovarian function suppression with Zoladex.  She will receive these injections for a few months and then decide on oophorectomy.  Patient previously lost a lot of weight with Tirzapatide.   We briefly discussed about guardant reveal for MRD testing Return to clinic in 3 months for survivorship care plan visit. Monthly injections for Zoladex.  Assessment & Plan Radiation-induced lymphedema and pain, left upper extremity Symptoms improved with less swelling and pain. Lymphedema evaluation planned. - Continue with lymphedema evaluation.  Menopausal symptoms secondary to cancer therapy Symptoms include hot flashes, mood swings, and joint stiffness. Managed  with lifestyle modifications and monitoring. - Monitor symptoms and manage with lifestyle modifications.  Risk of osteoporosis due to planned ovarian suppression and aromatase inhibitor therapy Ovarian suppression and aromatase inhibitors increase osteoporosis risk. Tamoxifen may improve bone density. - Monitor bone density every two years. - Continue calcium and vitamin D  supplementation. - Encouraged weight-bearing exercises.  Urinary frequency Without pain or burning. Discussed potential use of AZO. - Consider AZO for urinary frequency.      No orders of the defined types were placed in this encounter.  The patient has a good understanding of the overall plan. she agrees with it. she will call with any problems that may develop before the next visit here.  I personally spent a total of 30 minutes in the care of the patient today including preparing to see the patient, getting/reviewing separately obtained history, performing a medically appropriate exam/evaluation, counseling and educating, placing orders, referring and communicating with other health care professionals, documenting clinical information in the EHR, independently interpreting results, communicating  results, and coordinating care.   Viinay K Goebel Hellums, MD 11/21/23

## 2023-11-22 ENCOUNTER — Telehealth: Payer: Self-pay

## 2023-11-22 ENCOUNTER — Other Ambulatory Visit: Payer: Self-pay

## 2023-11-22 ENCOUNTER — Ambulatory Visit
Admission: RE | Admit: 2023-11-22 | Discharge: 2023-11-22 | Disposition: A | Discharge status: Home / Self Care | Source: Ambulatory Visit | Attending: Radiation Oncology | Admitting: Radiation Oncology

## 2023-11-22 DIAGNOSIS — Z5111 Encounter for antineoplastic chemotherapy: Secondary | ICD-10-CM | POA: Diagnosis not present

## 2023-11-22 LAB — RAD ONC ARIA SESSION SUMMARY
Course Elapsed Days: 23
Plan Fractions Treated to Date: 2
Plan Prescribed Dose Per Fraction: 2 Gy
Plan Total Fractions Prescribed: 5
Plan Total Prescribed Dose: 10 Gy
Reference Point Dosage Given to Date: 4 Gy
Reference Point Session Dosage Given: 2 Gy
Session Number: 17

## 2023-11-22 NOTE — Telephone Encounter (Signed)
 Per md orders placed for Guardant Reveal through their portal.

## 2023-11-23 ENCOUNTER — Ambulatory Visit
Admission: RE | Admit: 2023-11-23 | Discharge: 2023-11-23 | Disposition: A | Discharge status: Home / Self Care | Source: Ambulatory Visit | Attending: Radiation Oncology | Admitting: Radiation Oncology

## 2023-11-23 ENCOUNTER — Other Ambulatory Visit: Payer: Self-pay

## 2023-11-23 DIAGNOSIS — Z5111 Encounter for antineoplastic chemotherapy: Secondary | ICD-10-CM | POA: Diagnosis not present

## 2023-11-23 LAB — RAD ONC ARIA SESSION SUMMARY
Course Elapsed Days: 24
Plan Fractions Treated to Date: 3
Plan Prescribed Dose Per Fraction: 2 Gy
Plan Total Fractions Prescribed: 5
Plan Total Prescribed Dose: 10 Gy
Reference Point Dosage Given to Date: 6 Gy
Reference Point Session Dosage Given: 2 Gy
Session Number: 18

## 2023-11-24 ENCOUNTER — Other Ambulatory Visit: Payer: Self-pay

## 2023-11-24 ENCOUNTER — Ambulatory Visit

## 2023-11-24 ENCOUNTER — Ambulatory Visit
Admission: RE | Admit: 2023-11-24 | Discharge: 2023-11-24 | Disposition: A | Discharge status: Home / Self Care | Source: Ambulatory Visit | Attending: Radiation Oncology | Admitting: Radiation Oncology

## 2023-11-24 DIAGNOSIS — Z5111 Encounter for antineoplastic chemotherapy: Secondary | ICD-10-CM | POA: Diagnosis not present

## 2023-11-24 LAB — RAD ONC ARIA SESSION SUMMARY
Course Elapsed Days: 25
Plan Fractions Treated to Date: 4
Plan Prescribed Dose Per Fraction: 2 Gy
Plan Total Fractions Prescribed: 5
Plan Total Prescribed Dose: 10 Gy
Reference Point Dosage Given to Date: 8 Gy
Reference Point Session Dosage Given: 2 Gy
Session Number: 19

## 2023-11-25 NOTE — Therapy (Signed)
 OUTPATIENT PHYSICAL THERAPY  UPPER EXTREMITY ONCOLOGY EVALUATION  Patient Name: Stacy [name] MRN: [id number] DOB:May 03, 1980, 43 y.o., female Today's Date: 11/27/2023  END OF SESSION:  PT End of Session - 11/27/23 0907     Visit Number 1    Number of Visits 1    Date for Recertification  11/27/23    Authorization Type none needed    PT Start Time 0801    PT Stop Time 0858    PT Time Calculation (min) 57 min    Activity Tolerance Patient tolerated treatment well    Behavior During Therapy [name] for tasks assessed/performed          Past Medical History:  Diagnosis Date   AMA (advanced maternal age) multigravida 35+ 11/02/2017   Gestational diabetes    glyburide    Gestational diabetes mellitus (GDM) controlled on oral hypoglycemic drug, antepartum 01/11/2018   Referral to diabetic education  Guidelines for Antenatal Testing and Sonography  (with updated ICD-10 codes)  Updated  2017-09-19 with Dr. Fredia [name]           INDICATION    U/S    2 X week NST/AFI   or full BPP wkly    DELIVERY      Diabetes    A1 - good control - O24.410       A2 - good control - O24.419         A2  - poor control or poor compliance - O24.419, E11.65    (Macrosomia or polyhydram   Gestational diabetes mellitus (GDM) in third trimester 06/21/2018   Supervision of high risk pregnancy, antepartum 11/02/2017              Nursing Staff    Provider      Office Location     [name]     Dating     LMP c/w 11 week us       Language     ENGLISH    Anatomy US      Wnl, f/u in 4 weeks      Flu Vaccine      01/29/2018    Genetic Screen     NIPS: nl female  AFP:       TDaP vaccine          Hgb A1C or   GTT    Early elevated 2 hour@15wk =GDM         Rhogam     N/a          LAB RESULTS       Feeding Plan    Breast     Blo   Past Surgical History:  Procedure Laterality Date   BREAST LUMPECTOMY Left    IR IMAGING GUIDED PORT INSERTION  07/20/2023   MULTIPLE TOOTH EXTRACTIONS Bilateral    for orthodontic care   Patient  Active Problem List   Diagnosis Date Noted   Port-A-Cath in place 07/25/2023   Malignant neoplasm of upper-outer quadrant of left breast in female, estrogen receptor positive (HCC) 07/11/2023   Attention deficit disorder (ADD) in adult 06/12/2023   Overweight (BMI 25.0-29.9) 11/23/2020   Impaired fasting glucose 05/06/2014    PCP:   REFERRING PROVIDER: Lauraine Golden, MD  REFERRING DIAG: s/p Left lumpectomy with SLNB  THERAPY DIAG:  Malignant neoplasm of upper-outer quadrant of left breast in female, estrogen receptor positive (HCC)  Aftercare following surgery for neoplasm  Stiffness of left shoulder, not elsewhere classified  At risk for lymphedema  ONSET DATE:  04/26/2023  Rationale for Evaluation and Treatment: Rehabilitation  SUBJECTIVE:                                                                                                                                                                                           SUBJECTIVE STATEMENT:  When I wake up in the am My legs area little painful, but once I get moving I am good.  They put me on Lasix for a while for swelling in my hands and legs but it is better now. I  feel some tightness in the left axillary region more in the skin than anything. I have my last radiation today. 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 She is s/p Left lumpectomy on 06/06/2023: 2.3 cm grade 2 ILC margins -0/1 sentinel lymph node, ER 21 to 30% weak, PR 70 to 80% moderate, HER2 1+ negative, Ki-67 40% .  Oncotype 27, 07/26/2023 Patient is on Treatment Plan : BREAST TC q 21d with final chemotherapy on 09/27/2023. Pt has last radiation today. [name]  PAIN:  Are you having pain? No  PRECAUTIONS: Left UE lymphedema risk  RED FLAGS: None   WEIGHT BEARING RESTRICTIONS: No  FALLS:  Has patient fallen in  last 6 months? 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  COGNITION: Overall cognitive status: Within functional limits for tasks assessed   PALPATION: Did not palpate axillary incision due to irritation from radiation, no tenderness at areolar incision. Both well healed  OBSERVATIONS / OTHER ASSESSMENTS: Very burned at axillary incision and just 1 more radiation left, breast incision mildly red;no increased scar tissue noted. Breast mildly drawn up but no significant swelling noted. Pores more visible most likely from radiation  SENSATION: Light touch: Deficits    POSTURE: forward head, rounded shoulders  UPPER EXTREMITY AROM/PROM:  A/PROM RIGHT   eval   Shoulder extension 47  Shoulder flexion 145  Shoulder abduction 165  Shoulder internal rotation 63  Shoulder external rotation 102    (Blank rows = not tested)  A/PROM LEFT   eval  Shoulder extension  45  Shoulder flexion 148, pulls  Shoulder abduction 132  Shoulder internal rotation 60  Shoulder external rotation 100    (Blank rows = not tested)  CERVICAL AROM: All within FXL limits:   UPPER EXTREMITY STRENGTH:   LYMPHEDEMA ASSESSMENTS:   SURGERY TYPE/DATE:06/06/2023 left Lumpectomy with SLNB  NUMBER OF LYMPH NODES REMOVED: 0+/1  CHEMOTHERAPY: Yes, completed  RADIATION:Yes presently  HORMONE TREATMENT: Anastozole after XRT  INFECTIONS: NO   LYMPHEDEMA ASSESSMENTS:   LANDMARK RIGHT  eval  At axilla  32.4  15 cm proximal to the proximal aspect of the olecranon process   10 cm  proximal to the proximal aspect of the olecranon process 31.0  Olecranon process 25.2  15 cm proximal to the proximal aspect of the ulnar styloid process   10 cm proximal to the proximal aspect of the ulnar styloid process 22.1  Just distal to the ulnar styloid process 14.7  Across hand at thumb web space 17.7  At base of 2nd digit 6.35  (Blank rows = not tested)  LANDMARK LEFT  eval  At axilla  31.0  15 cm proximal to the proximal aspect of the olecranon process   10 cm proximal to the proximal aspect of the olecranon process 29.4  Olecranon process 24.5  15 cm proximal to the proximal aspect of the ulnar styloid process   10 cm proximal to  the proximal aspect of the ulnar styloid process 20.7  Just distal to the ulnar styloid process 14.6  Across hand at thumb web space 17.7  At base of 2nd digit 6.2  (Blank rows = not tested)  Chest circumference just inferior to the axillae:  Chest circumference at the largest point:      L-DEX LYMPHEDEMA SCREENING: The patient was assessed using the L-Dex machine today to produce a lymphedema index baseline score. The patient will be reassessed on a regular basis (typically every 3 months) to obtain new L-Dex scores. If the score is > 6.5 points away from his/her baseline score indicating onset of subclinical lymphedema, it will be recommended to wear a compression garment for 4 weeks, 12 hours per day and then be reassessed. If the score continues to be > 6.5 points from baseline at reassessment, we will initiate lymphedema treatment. Assessing in this manner has a 95% rate of preventing clinically significant lymphedema.  L-DEX FLOWSHEETS - 11/27/23 0900       L-DEX LYMPHEDEMA SCREENING   Measurement Type Unilateral    L-DEX MEASUREMENT EXTREMITY Upper Extremity    POSITION  Standing    DOMINANT SIDE Right    At Risk Side Left    BASELINE SCORE (UNILATERAL) 1.2         Baseline done nearly 6 months post surgery, but no sign of  swelling  in left UE. Did have bilateral swelling due to chemo but now resolved on left, and still mild on Right.  QUICK DASH SURVEY: 25%  TREATMENT DATE:  11/27/2023 Instructed pt in 4 post op exercises to perform 2x/day until full ROM is achieved, wear compression bra for several more months if can wear comfortably, scar massage when skin healed from radiation, discussed video, gave link, and advised that she watch. Briefly discussed lymphedema and avoiding needle sticks and BP on the left side. Gave pt script for compression sleeve, 20-30 to wear for travel as a precaution. Advised to watch for breast/arm swelling and to contact us  if we can help her in any way. Set up for SOZO screen in 3 months, (does not have a true baseline, but no sign of Left UE swelling today.    PATIENT EDUCATION:  Education details: Instructed pt in 4 post op exercises to perform 2x/day until full ROM is achieved, wear compression bra for several more months if can wear comfortably, scar massage when skin healed from radiation, discussed video, gave link, and advised that she watch. Briefly discussed lymphedema and avoiding needle sticks and BP on the left side. Gave pt script for compression sleeve, 20-30 to wear for travel as a precaution. Advised to watch for breast/arm swelling and to contact us  if we can help her in any way. Set up for SOZO screen in 3 months, (does not have a true baseline, but no sign of Left UE swelling today. Person educated: Patient Education method: Explanation, Demonstration, and Handouts Education comprehension: verbalized understanding and returned demonstration  HOME EXERCISE PROGRAM: 4 post op exercises, flexion and stargazer in supine, watch video  ASSESSMENT:  CLINICAL IMPRESSION: Patient is a 43 y.o. female who was seen today for physical therapy evaluation  and treatment s/p Left lumpectomy with SLNB on 06/06/2023. She also underwent chemotherapy and is presently having radiation with last one today. Pt was seen for a requested lymphedema screen by MD. She did have some UE and LE swelling associated with chemotherapy which has improved. Assessed ROM with mild limitation in left shoulder ROM of abduction. Educated pt in 4 post of exercises and explained importance of working on ROM to restore to full. Educated in  scar massage which she will start after skin recovers from radiation. Briefly discussed lymphedema and encouraged pt to watch ABC video. There are no further PT needs presently, however, pt was encouraged to call with any questions or concerns or if she has difficulty regaining ROM. She was set up for Non true baseline SOZO screen in 3 months.   OBJECTIVE IMPAIRMENTS: decreased knowledge of condition, decreased ROM, increased edema, and impaired UE functional use.   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   REHAB POTENTIAL: Excellent  CLINICAL DECISION MAKING: Stable/uncomplicated  EVALUATION COMPLEXITY: Low  GOALS: Goals reviewed with patient? Yes  SHORT TERM GOALS=LONG TERM GOALS: Target date: 11/27/2023  Pt will be educated in HEP to restore fxl ROM to left shoulder Baseline: Goal status: MET  2.  Pt will be set up for 3 month SOZO screen Baseline:  Goal status: MET 3.  Pt will be educated to watch ABC video Baseline:  Goal status: MET  4.  pt will be educated in scar massage to perform when skin is healed from radiation Baseline:  Goal status: INITIAL   PLAN:  PT FREQUENCY: one time visit  PT DURATION: 1 week  PLANNED INTERVENTIONS: 97110-Therapeutic exercises, 97535- Self Care,  Patient/Family education, Balance training, Joint mobilization, Therapeutic exercises, Therapeutic activity, Neuromuscular re-education, Gait training, and Self Care  PLAN FOR NEXT SESSION: Pt is discharged to independent self management, but will contact us  with any questions or concerns. PHYSICAL THERAPY DISCHARGE SUMMARY  Visits from Start of Care: 1  Current functional level related to goals / functional outcomes: Achieved goals   Remaining deficits: Decreased ROM left abd but pt instructed how to improve   Education / Equipment: HEP   Patient agrees to discharge. Patient goals were met. Patient is being discharged due to meeting the stated rehab goals.   Grayce JINNY Sheldon, PT 11/27/2023, 9:08 AM

## 2023-11-27 ENCOUNTER — Ambulatory Visit: Attending: Radiation Oncology

## 2023-11-27 ENCOUNTER — Inpatient Hospital Stay: Admitting: Hematology and Oncology

## 2023-11-27 ENCOUNTER — Other Ambulatory Visit: Payer: Self-pay

## 2023-11-27 ENCOUNTER — Ambulatory Visit
Admission: RE | Admit: 2023-11-27 | Discharge: 2023-11-27 | Disposition: A | Discharge status: Home / Self Care | Source: Ambulatory Visit | Attending: Radiation Oncology | Admitting: Radiation Oncology

## 2023-11-27 ENCOUNTER — Inpatient Hospital Stay

## 2023-11-27 VITALS — BP 122/77 | HR 81 | Temp 98.4°F | Resp 15

## 2023-11-27 DIAGNOSIS — Z170 Estrogen receptor positive status [ER+]: Secondary | ICD-10-CM | POA: Diagnosis present

## 2023-11-27 DIAGNOSIS — Z483 Aftercare following surgery for neoplasm: Secondary | ICD-10-CM | POA: Insufficient documentation

## 2023-11-27 DIAGNOSIS — Z5111 Encounter for antineoplastic chemotherapy: Secondary | ICD-10-CM | POA: Diagnosis not present

## 2023-11-27 DIAGNOSIS — Z9189 Other specified personal risk factors, not elsewhere classified: Secondary | ICD-10-CM | POA: Insufficient documentation

## 2023-11-27 DIAGNOSIS — C50412 Malignant neoplasm of upper-outer quadrant of left female breast: Secondary | ICD-10-CM | POA: Diagnosis present

## 2023-11-27 DIAGNOSIS — M25612 Stiffness of left shoulder, not elsewhere classified: Secondary | ICD-10-CM | POA: Diagnosis present

## 2023-11-27 LAB — RAD ONC ARIA SESSION SUMMARY
Course Elapsed Days: 28
Plan Fractions Treated to Date: 5
Plan Prescribed Dose Per Fraction: 2 Gy
Plan Total Fractions Prescribed: 5
Plan Total Prescribed Dose: 10 Gy
Reference Point Dosage Given to Date: 10 Gy
Reference Point Session Dosage Given: 2 Gy
Session Number: 20

## 2023-11-27 MED ORDER — GOSERELIN ACETATE 3.6 MG ~~LOC~~ IMPL
3.6000 mg | DRUG_IMPLANT | Freq: Once | SUBCUTANEOUS | Status: AC
  Administered 2023-11-27: 3.6 mg via SUBCUTANEOUS
  Filled 2023-11-27: qty 3.6

## 2023-11-27 NOTE — Patient Instructions (Signed)
 [name]

## 2023-11-28 NOTE — Radiation Completion Notes (Signed)
 Patient Name: [name], CASTELLI MRN: [id number] Date of Birth: [birth year] Referring Physician: MACKEY CHAD, M.D. Date of Service: 2023-11-28 Radiation Oncologist: Lauraine Golden, M.D. [facility] [place]                             RADIATION ONCOLOGY END OF TREATMENT NOTE     Diagnosis: C50.412 Malignant neoplasm of upper-outer quadrant of left female breast Intent: Curative     ==========DELIVERED PLANS==========  First Treatment Date: 2023-10-30 Last Treatment Date: 2023-11-27   Plan Name: Breast_L_BH Site: Breast, Left Technique: 3D Mode: Photon Dose Per Fraction: 2.67 Gy Prescribed Dose (Delivered / Prescribed): 40.05 Gy / 40.05 Gy Prescribed Fxs (Delivered / Prescribed): 15 / 15   Plan Name: Brst_L_BH_Bst Site: Breast, Left Technique: 3D Mode: Photon Dose Per Fraction: 2 Gy Prescribed Dose (Delivered / Prescribed): 10 Gy / 10 Gy Prescribed Fxs (Delivered / Prescribed): 5 / 5     ==========ON TREATMENT VISIT DATES========== 2023-10-30, 2023-11-06, 2023-11-13, 2023-11-20, 2023-11-27     ==========UPCOMING VISITS========== 02/05/2024 [facility] ONCOLOGY INJECTION [facility] FLUSH  01/01/2024 [facility] ONCOLOGY INJECTION [facility] FLUSH  12/28/2023 [facility]-RADIATION ONC FOLLOW UP 20 Golden Lauraine, MD  12/01/2023 [facility]INTERV RAD IR [facility]1/GI315 IR [facility]-IR 1        ==========APPENDIX - ON TREATMENT VISIT NOTES==========   See weekly On Treatment Notes in Epic for details in the Media tab (listed as Progress notes on the On Treatment Visit Dates listed above).

## 2023-12-01 ENCOUNTER — Ambulatory Visit (HOSPITAL_COMMUNITY)
Admission: RE | Admit: 2023-12-01 | Discharge: 2023-12-01 | Disposition: A | Discharge status: Home / Self Care | Source: Ambulatory Visit | Attending: Hematology and Oncology | Admitting: Hematology and Oncology

## 2023-12-01 DIAGNOSIS — Z452 Encounter for adjustment and management of vascular access device: Secondary | ICD-10-CM | POA: Diagnosis present

## 2023-12-01 DIAGNOSIS — Z95828 Presence of other vascular implants and grafts: Secondary | ICD-10-CM

## 2023-12-01 HISTORY — PX: IR REMOVAL TUN ACCESS W/ PORT W/O FL MOD SED: IMG2290

## 2023-12-01 MED ORDER — LIDOCAINE-EPINEPHRINE 1 %-1:100000 IJ SOLN
INTRAMUSCULAR | Status: AC
Start: 2023-12-01 — End: 2023-12-01
  Filled 2023-12-01: qty 1

## 2023-12-01 MED ORDER — LIDOCAINE-EPINEPHRINE 1 %-1:100000 IJ SOLN
20.0000 mL | Freq: Once | INTRAMUSCULAR | Status: AC
  Administered 2023-12-01: 8 mL via INTRADERMAL

## 2023-12-01 NOTE — Procedures (Signed)
 Interventional Radiology Procedure Note  Procedure: Chest Port removal  Complications: None  Estimated Blood Loss: < 10 mL  Findings: Right jugular vein port removed  Stacy DELENA Banner, MD

## 2023-12-20 NOTE — Progress Notes (Incomplete)
 [name] is here today for follow up post radiation to the breast.   Breast Side:***   They completed their radiation on: ***   Does the patient complain of any of the following: Post radiation skin issues: *** Breast Tenderness: *** Breast Swelling: *** Lymphadema: *** Range of Motion limitations: *** Fatigue post radiation: *** Appetite good/fair/poor: ***  Additional comments if applicable:

## 2023-12-28 ENCOUNTER — Ambulatory Visit: Admission: RE | Admit: 2023-12-28 | Admitting: Radiation Oncology

## 2023-12-28 DIAGNOSIS — C50412 Malignant neoplasm of upper-outer quadrant of left female breast: Secondary | ICD-10-CM

## 2024-01-01 ENCOUNTER — Inpatient Hospital Stay: Attending: Hematology and Oncology

## 2024-01-01 VITALS — BP 114/63 | HR 72 | Temp 97.9°F | Resp 18

## 2024-01-01 DIAGNOSIS — Z5111 Encounter for antineoplastic chemotherapy: Secondary | ICD-10-CM | POA: Diagnosis present

## 2024-01-01 DIAGNOSIS — Z1721 Progesterone receptor positive status: Secondary | ICD-10-CM | POA: Insufficient documentation

## 2024-01-01 DIAGNOSIS — C50412 Malignant neoplasm of upper-outer quadrant of left female breast: Secondary | ICD-10-CM | POA: Insufficient documentation

## 2024-01-01 DIAGNOSIS — Z170 Estrogen receptor positive status [ER+]: Secondary | ICD-10-CM | POA: Insufficient documentation

## 2024-01-01 DIAGNOSIS — Z1732 Human epidermal growth factor receptor 2 negative status: Secondary | ICD-10-CM | POA: Insufficient documentation

## 2024-01-01 MED ORDER — GOSERELIN ACETATE 3.6 MG ~~LOC~~ IMPL
3.6000 mg | DRUG_IMPLANT | Freq: Once | SUBCUTANEOUS | Status: AC
  Administered 2024-01-01: 3.6 mg via SUBCUTANEOUS
  Filled 2024-01-01: qty 3.6

## 2024-01-15 ENCOUNTER — Encounter: Payer: Self-pay | Admitting: Hematology and Oncology

## 2024-02-05 ENCOUNTER — Inpatient Hospital Stay

## 2024-02-06 ENCOUNTER — Inpatient Hospital Stay

## 2024-02-06 ENCOUNTER — Other Ambulatory Visit: Payer: Self-pay | Admitting: *Deleted

## 2024-02-06 ENCOUNTER — Inpatient Hospital Stay: Attending: Hematology and Oncology

## 2024-02-06 VITALS — BP 120/87 | HR 92 | Temp 98.1°F | Resp 17

## 2024-02-06 DIAGNOSIS — Z1721 Progesterone receptor positive status: Secondary | ICD-10-CM | POA: Insufficient documentation

## 2024-02-06 DIAGNOSIS — Z1732 Human epidermal growth factor receptor 2 negative status: Secondary | ICD-10-CM | POA: Diagnosis not present

## 2024-02-06 DIAGNOSIS — C50412 Malignant neoplasm of upper-outer quadrant of left female breast: Secondary | ICD-10-CM

## 2024-02-06 DIAGNOSIS — Z170 Estrogen receptor positive status [ER+]: Secondary | ICD-10-CM | POA: Diagnosis not present

## 2024-02-06 DIAGNOSIS — Z5111 Encounter for antineoplastic chemotherapy: Secondary | ICD-10-CM | POA: Diagnosis present

## 2024-02-06 LAB — CMP (CANCER CENTER ONLY)
ALT: 31 U/L (ref 0–44)
AST: 22 U/L (ref 15–41)
Albumin: 4.8 g/dL (ref 3.5–5.0)
Alkaline Phosphatase: 83 U/L (ref 38–126)
Anion gap: 13 (ref 5–15)
BUN: 14 mg/dL (ref 6–20)
CO2: 26 mmol/L (ref 22–32)
Calcium: 10.2 mg/dL (ref 8.9–10.3)
Chloride: 104 mmol/L (ref 98–111)
Creatinine: 0.62 mg/dL (ref 0.44–1.00)
GFR, Estimated: >60 mL/min
Glucose, Bld: 110 mg/dL — ABNORMAL HIGH (ref 70–99)
Potassium: 4.3 mmol/L (ref 3.5–5.1)
Sodium: 143 mmol/L (ref 135–145)
Total Bilirubin: 0.3 mg/dL (ref 0.0–1.2)
Total Protein: 8.1 g/dL (ref 6.5–8.1)

## 2024-02-06 LAB — CBC WITH DIFFERENTIAL (CANCER CENTER ONLY)
Abs Immature Granulocytes: 0 10*3/uL (ref 0.00–0.07)
Basophils Absolute: 0 10*3/uL (ref 0.0–0.1)
Basophils Relative: 1 %
Eosinophils Absolute: 0.2 10*3/uL (ref 0.0–0.5)
Eosinophils Relative: 3 %
HCT: 41.7 % (ref 36.0–46.0)
Hemoglobin: 13.8 g/dL (ref 12.0–15.0)
Immature Granulocytes: 0 %
Lymphocytes Relative: 30 %
Lymphs Abs: 1.8 10*3/uL (ref 0.7–4.0)
MCH: 25.9 pg — ABNORMAL LOW (ref 26.0–34.0)
MCHC: 33.1 g/dL (ref 30.0–36.0)
MCV: 78.4 fL — ABNORMAL LOW (ref 80.0–100.0)
Monocytes Absolute: 0.4 10*3/uL (ref 0.1–1.0)
Monocytes Relative: 7 %
Neutro Abs: 3.7 10*3/uL (ref 1.7–7.7)
Neutrophils Relative %: 59 %
Platelet Count: 302 10*3/uL (ref 150–400)
RBC: 5.32 MIL/uL — ABNORMAL HIGH (ref 3.87–5.11)
RDW: 14.3 % (ref 11.5–15.5)
WBC Count: 6.1 10*3/uL (ref 4.0–10.5)
nRBC: 0 % (ref 0.0–0.2)

## 2024-02-06 LAB — VITAMIN B12: Vitamin B-12: 867 pg/mL (ref 180–914)

## 2024-02-06 MED ORDER — GOSERELIN ACETATE 3.6 MG ~~LOC~~ IMPL
3.6000 mg | DRUG_IMPLANT | Freq: Once | SUBCUTANEOUS | Status: AC
  Administered 2024-02-06: 3.6 mg via SUBCUTANEOUS
  Filled 2024-02-06: qty 3.6

## 2024-02-07 ENCOUNTER — Encounter: Payer: Self-pay | Admitting: Hematology and Oncology

## 2024-02-14 ENCOUNTER — Encounter: Payer: Self-pay | Admitting: Hematology and Oncology

## 2024-02-14 ENCOUNTER — Encounter: Payer: Self-pay | Admitting: *Deleted

## 2024-02-15 ENCOUNTER — Encounter: Payer: Self-pay | Admitting: Hematology and Oncology

## 2024-02-16 LAB — GUARDANT REVEAL

## 2024-02-21 ENCOUNTER — Inpatient Hospital Stay: Attending: Hematology and Oncology | Admitting: Hematology and Oncology

## 2024-02-23 ENCOUNTER — Inpatient Hospital Stay: Admitting: Adult Health

## 2024-03-04 ENCOUNTER — Inpatient Hospital Stay

## 2024-03-04 ENCOUNTER — Ambulatory Visit

## 2024-03-04 ENCOUNTER — Inpatient Hospital Stay: Admitting: Adult Health
